# Patient Record
Sex: Female | Born: 1970 | Race: White | Hispanic: No | Smoking: Never smoker
Health system: Southern US, Community
[De-identification: ages and names within clinical notes are randomized; demographics above are authoritative.]

## PROBLEM LIST (undated history)

## (undated) DIAGNOSIS — F419 Anxiety disorder, unspecified: Secondary | ICD-10-CM

## (undated) DIAGNOSIS — G629 Polyneuropathy, unspecified: Secondary | ICD-10-CM

## (undated) DIAGNOSIS — E119 Type 2 diabetes mellitus without complications: Secondary | ICD-10-CM

## (undated) DIAGNOSIS — F32A Depression, unspecified: Secondary | ICD-10-CM

## (undated) DIAGNOSIS — I10 Essential (primary) hypertension: Secondary | ICD-10-CM

## (undated) DIAGNOSIS — E669 Obesity, unspecified: Secondary | ICD-10-CM

## (undated) DIAGNOSIS — E785 Hyperlipidemia, unspecified: Secondary | ICD-10-CM

## (undated) DIAGNOSIS — F329 Major depressive disorder, single episode, unspecified: Secondary | ICD-10-CM

## (undated) HISTORY — DX: Obesity, unspecified: E66.9

## (undated) HISTORY — DX: Major depressive disorder, single episode, unspecified: F32.9

## (undated) HISTORY — DX: Hyperlipidemia, unspecified: E78.5

## (undated) HISTORY — PX: WISDOM TOOTH EXTRACTION: SHX21

## (undated) HISTORY — DX: Depression, unspecified: F32.A

---

## 1998-04-30 ENCOUNTER — Other Ambulatory Visit: Admission: RE | Admit: 1998-04-30 | Discharge: 1998-04-30 | Payer: Self-pay | Admitting: Gynecology

## 1998-06-16 ENCOUNTER — Other Ambulatory Visit: Admission: RE | Admit: 1998-06-16 | Discharge: 1998-06-16 | Payer: Self-pay | Admitting: Obstetrics & Gynecology

## 1998-07-31 ENCOUNTER — Inpatient Hospital Stay (HOSPITAL_COMMUNITY): Admission: AD | Admit: 1998-07-31 | Discharge: 1998-08-02 | Payer: Self-pay | Admitting: Obstetrics & Gynecology

## 1998-09-04 ENCOUNTER — Other Ambulatory Visit: Admission: RE | Admit: 1998-09-04 | Discharge: 1998-09-04 | Payer: Self-pay | Admitting: Obstetrics & Gynecology

## 1999-10-22 ENCOUNTER — Emergency Department (HOSPITAL_COMMUNITY): Admission: EM | Admit: 1999-10-22 | Discharge: 1999-10-22 | Payer: Self-pay | Admitting: Emergency Medicine

## 2000-09-13 ENCOUNTER — Emergency Department (HOSPITAL_COMMUNITY): Admission: EM | Admit: 2000-09-13 | Discharge: 2000-09-13 | Payer: Self-pay | Admitting: Emergency Medicine

## 2001-04-17 ENCOUNTER — Emergency Department (HOSPITAL_COMMUNITY): Admission: EM | Admit: 2001-04-17 | Discharge: 2001-04-17 | Payer: Self-pay | Admitting: Emergency Medicine

## 2003-09-06 ENCOUNTER — Ambulatory Visit (HOSPITAL_COMMUNITY): Admission: RE | Admit: 2003-09-06 | Discharge: 2003-09-06 | Payer: Self-pay | Admitting: Family Medicine

## 2004-02-28 ENCOUNTER — Emergency Department (HOSPITAL_COMMUNITY): Admission: AD | Admit: 2004-02-28 | Discharge: 2004-02-28 | Payer: Self-pay | Admitting: Family Medicine

## 2006-02-06 ENCOUNTER — Emergency Department (HOSPITAL_COMMUNITY): Admission: EM | Admit: 2006-02-06 | Discharge: 2006-02-06 | Payer: Self-pay | Admitting: *Deleted

## 2007-05-18 ENCOUNTER — Emergency Department (HOSPITAL_COMMUNITY): Admission: EM | Admit: 2007-05-18 | Discharge: 2007-05-18 | Payer: Self-pay | Admitting: Emergency Medicine

## 2007-10-07 ENCOUNTER — Emergency Department (HOSPITAL_COMMUNITY): Admission: EM | Admit: 2007-10-07 | Discharge: 2007-10-07 | Payer: Self-pay | Admitting: Emergency Medicine

## 2008-10-26 ENCOUNTER — Emergency Department (HOSPITAL_COMMUNITY): Admission: EM | Admit: 2008-10-26 | Discharge: 2008-10-26 | Payer: Self-pay | Admitting: Family Medicine

## 2011-11-12 ENCOUNTER — Encounter: Payer: Self-pay | Admitting: Emergency Medicine

## 2011-11-12 ENCOUNTER — Emergency Department (INDEPENDENT_AMBULATORY_CARE_PROVIDER_SITE_OTHER)
Admission: EM | Admit: 2011-11-12 | Discharge: 2011-11-12 | Disposition: A | Discharge status: Other Acute Inpatient Hospital | Payer: Medicaid Other | Source: Home / Self Care | Attending: Family Medicine | Admitting: Family Medicine

## 2011-11-12 DIAGNOSIS — J310 Chronic rhinitis: Secondary | ICD-10-CM

## 2011-11-12 HISTORY — DX: Essential (primary) hypertension: I10

## 2011-11-12 MED ORDER — GUAIFENESIN-CODEINE 100-10 MG/5ML PO SYRP: 5.0000 mL | ORAL_SOLUTION | Freq: Four times a day (QID) | ORAL | Status: AC | PRN

## 2011-11-12 MED ORDER — FLUTICASONE PROPIONATE 50 MCG/ACT NA SUSP: 2.0000 | Freq: Every day | NASAL | Status: DC

## 2011-11-12 NOTE — ED Provider Notes (Signed)
History     CSN: [id number] Arrival date & time: 11/12/2011  4:08 PM   First MD Initiated Contact with Patient 11/12/11 1601      Chief Complaint  Patient presents with  . Cough    (Consider location/radiation/quality/duration/timing/severity/associated sxs/prior treatment) HPI Comments: Emily Franco presents for evaluation of persistent non-productive cough and nasal congestion. She saw her PCP on Monday and was given a medicine that was not covered by her insurance.   Patient is a 40 y.o. female presenting with cough. The history is provided by the patient.  Cough This is a new problem. The current episode started more than 1 week ago. The problem occurs constantly. The cough is non-productive. There has been no fever. Associated symptoms include chills, ear pain, rhinorrhea and myalgias. Pertinent negatives include no sore throat, no shortness of breath and no wheezing. She has tried decongestants for the symptoms. The treatment provided no relief. She is not a smoker.    Past Medical History  Diagnosis Date  . Hypertension     History reviewed. No pertinent past surgical history.  No family history on file.  History  Substance Use Topics  . Smoking status: Never Smoker   . Smokeless tobacco: Not on file  . Alcohol Use: Yes     occasional    OB History    Grav Para Term Preterm Abortions TAB SAB Ect Mult Living                  Review of Systems  Constitutional: Positive for fever and chills.  HENT: Positive for ear pain, congestion, rhinorrhea and sneezing. Negative for sore throat and trouble swallowing.   Eyes: Negative.   Respiratory: Positive for cough. Negative for shortness of breath and wheezing.   Cardiovascular: Negative.   Gastrointestinal: Negative.   Genitourinary: Negative.   Musculoskeletal: Positive for myalgias.  Skin: Negative.     Allergies  Review of patient's allergies indicates not on file.  Home Medications   Current Outpatient Rx    Name Route Sig Dispense Refill  . CITALOPRAM HYDROBROMIDE 40 MG PO TABS Oral Take 40 mg by mouth daily.      Marland Kitchen DM-GUAIFENESIN ER 30-600 MG PO TB12 Oral Take 1 tablet by mouth every 12 (twelve) hours.      Marland Kitchen LISINOPRIL 30 MG PO TABS Oral Take 30 mg by mouth daily.        BP 123/81  Pulse 87  Temp(Src) 98.8 F (37.1 C) (Oral)  Resp 17  SpO2 98%  LMP 10/16/2011  Physical Exam  Constitutional: She is oriented to person, place, and time. She appears well-developed and well-nourished.  HENT:  Head: Normocephalic and atraumatic.  Right Ear: External ear normal. Tympanic membrane is retracted. A middle ear effusion is present.  Left Ear: External ear normal. Tympanic membrane is retracted. A middle ear effusion is present.  Nose: Nose normal.  Mouth/Throat: Uvula is midline, oropharynx is clear and moist and mucous membranes are normal.  Eyes: EOM are normal. Pupils are equal, round, and reactive to light.  Neck: Normal range of motion.  Cardiovascular: Normal rate and regular rhythm.   Pulmonary/Chest: Effort normal and breath sounds normal.  Neurological: She is alert and oriented to person, place, and time.  Skin: Skin is warm and dry.    ED Course  Procedures (including critical care time)  Labs Reviewed - No data to display No results found.   No diagnosis found.    MDM  Rhinitis, serous otitis  with effusion        Richardo Priest, MD 11/12/11 [phone]

## 2011-11-12 NOTE — ED Notes (Signed)
Went to PCP on Tue and was given prescription cough med, was not helping. Pt could not fill script for Tessalon due to insurance coverage.

## 2011-11-12 NOTE — ED Notes (Signed)
Non-productive Cough, headache, sore throat. Started about 5 days ago, progressively worse.

## 2011-11-24 ENCOUNTER — Emergency Department (HOSPITAL_COMMUNITY): Payer: Medicaid Other

## 2011-11-24 ENCOUNTER — Encounter (HOSPITAL_COMMUNITY): Payer: Self-pay | Admitting: *Deleted

## 2011-11-24 ENCOUNTER — Emergency Department (HOSPITAL_COMMUNITY)
Admission: EM | Admit: 2011-11-24 | Discharge: 2011-11-24 | Disposition: A | Discharge status: Home / Self Care | Payer: Medicaid Other | Attending: Emergency Medicine | Admitting: Emergency Medicine

## 2011-11-24 DIAGNOSIS — R05 Cough: Secondary | ICD-10-CM | POA: Insufficient documentation

## 2011-11-24 DIAGNOSIS — R059 Cough, unspecified: Secondary | ICD-10-CM | POA: Insufficient documentation

## 2011-11-24 DIAGNOSIS — J40 Bronchitis, not specified as acute or chronic: Secondary | ICD-10-CM | POA: Insufficient documentation

## 2011-11-24 DIAGNOSIS — R0602 Shortness of breath: Secondary | ICD-10-CM | POA: Insufficient documentation

## 2011-11-24 DIAGNOSIS — I10 Essential (primary) hypertension: Secondary | ICD-10-CM | POA: Insufficient documentation

## 2011-11-24 MED ORDER — BENZONATATE 100 MG PO CAPS: 100.0000 mg | ORAL_CAPSULE | Freq: Three times a day (TID) | ORAL | Status: AC

## 2011-11-24 MED ORDER — ALBUTEROL SULFATE HFA 108 (90 BASE) MCG/ACT IN AERS
2.0000 | INHALATION_SPRAY | RESPIRATORY_TRACT | Status: DC
  Administered 2011-11-24: 2 via RESPIRATORY_TRACT
  Filled 2011-11-24: qty 6.7

## 2011-11-24 MED ORDER — AZITHROMYCIN 250 MG PO TABS: 250.0000 mg | ORAL_TABLET | Freq: Every day | ORAL | Status: AC

## 2011-11-24 NOTE — ED Provider Notes (Signed)
History     CSN: [id number] Arrival date & time: 11/24/2011  7:32 PM   First MD Initiated Contact with Patient 11/24/11 2058      Chief Complaint  Patient presents with  . Cough    (Consider location/radiation/quality/duration/timing/severity/associated sxs/prior treatment) Patient is a 40 y.o. female presenting with cough. The history is provided by the patient.  Cough This is a new problem. The current episode started more than 1 week ago. The problem occurs constantly. The problem has not changed since onset.The cough is productive of sputum. There has been no fever. Pertinent negatives include no chest pain, no chills, no headaches, no rhinorrhea, no sore throat, no myalgias, no shortness of breath and no wheezing. She has tried nothing for the symptoms. The treatment provided no relief. She is not a smoker.  Pt states she gets cough episodes where she is coughing and unable to stop until she vomits. No history of the same. Denies chest pain or shortness of breath. NO other symptoms.   Past Medical History  Diagnosis Date  . Hypertension     History reviewed. No pertinent past surgical history.  No family history on file.  History  Substance Use Topics  . Smoking status: Never Smoker   . Smokeless tobacco: Not on file  . Alcohol Use: Yes     occasional    OB History    Grav Para Term Preterm Abortions TAB SAB Ect Mult Living                  Review of Systems  Constitutional: Negative for fever and chills.  HENT: Negative for sore throat, rhinorrhea, neck pain and neck stiffness.   Eyes: Negative.   Respiratory: Positive for cough. Negative for chest tightness, shortness of breath and wheezing.   Cardiovascular: Negative for chest pain and leg swelling.  Gastrointestinal: Negative.   Genitourinary: Negative.   Musculoskeletal: Negative.  Negative for myalgias.  Neurological: Negative.  Negative for headaches.  Psychiatric/Behavioral: Negative.     Allergies   Review of patient's allergies indicates no known allergies.  Home Medications   Current Outpatient Rx  Name Route Sig Dispense Refill  . VITAMIN D 1000 UNITS PO TABS Oral Take 1,000 Units by mouth daily.      Marland Kitchen CITALOPRAM HYDROBROMIDE 40 MG PO TABS Oral Take 40 mg by mouth daily.      Marland Kitchen VITAMIN B 12 PO Oral Take 1 tablet by mouth daily.      Marland Kitchen DM-GUAIFENESIN ER 30-600 MG PO TB12 Oral Take 1 tablet by mouth every 12 (twelve) hours as needed. For congestion.    . OMEGA-3 FATTY ACIDS 1000 MG PO CAPS Oral Take 1-2 g by mouth 2 (two) times daily.      Marland Kitchen FLUTICASONE PROPIONATE 50 MCG/ACT NA SUSP Nasal Place 2 sprays into the nose daily. 16 g 2  . LISINOPRIL 30 MG PO TABS Oral Take 30 mg by mouth daily.      Carma Leaven M PLUS PO TABS Oral Take 1 tablet by mouth daily.        BP 105/74  Pulse 95  Temp(Src) 98.8 F (37.1 C) (Oral)  Resp 18  SpO2 96%  LMP 10/16/2011  Physical Exam  Nursing note and vitals reviewed. Constitutional: She is oriented to person, place, and time. She appears well-developed and well-nourished. No distress.  HENT:  Head: Normocephalic.  Right Ear: External ear normal.  Left Ear: External ear normal.  Mouth/Throat: Oropharynx is clear and  moist.  Eyes: Conjunctivae are normal.  Neck: Normal range of motion. Neck supple.  Cardiovascular: Normal rate, regular rhythm and normal heart sounds.   Pulmonary/Chest: Effort normal and breath sounds normal. No respiratory distress. She has no wheezes.  Abdominal: Soft. Bowel sounds are normal.  Musculoskeletal: Normal range of motion. She exhibits no edema.  Lymphadenopathy:    She has no cervical adenopathy.  Neurological: She is alert and oriented to person, place, and time.  Skin: Skin is warm and dry. No rash noted.  Psychiatric: She has a normal mood and affect.    ED Course  Procedures (including critical care time)  No results found for this or any previous visit. Dg Chest 2 View  11/24/2011  *RADIOLOGY  REPORT*  Clinical Data: Shortness of breath and cough.  CHEST - 2 VIEW  Comparison: None.  Findings: The lungs are clear without focal infiltrate, edema, pneumothorax or pleural effusion. The cardiopericardial silhouette is within normal limits for size.  Imaged bony structures of the thorax are intact.  IMPRESSION: No acute cardiopulmonary process.  Original Report Authenticated By: ERIC A. MANSELL, M.D.    X-ray negative. Suspect bronchitis or atypical infection due to cough for 2 weeks with post tussive emesis. Will cover with z-pack. Albuterol inhaler provided. Will continue on cough medications. Instructed to follow up closely if not improving.    MDM          Lottie Mussel, PA 11/25/11 [phone]

## 2011-11-24 NOTE — ED Notes (Signed)
Pt reports non-productive cough x 2 weeks, reports being around family members who has the flu.  Pt reports she would cough for 10 minutes until she vomits.

## 2011-11-25 NOTE — ED Notes (Signed)
Prescription for z-pack and tessolon caps called to [facility] [phone].

## 2011-11-28 NOTE — ED Provider Notes (Signed)
Medical screening examination/treatment/procedure(s) were performed by non-physician practitioner and as supervising physician I was immediately available for consultation/collaboration.   Milam Allbaugh, MD 11/28/11 0807 

## 2013-02-27 ENCOUNTER — Telehealth: Payer: Self-pay | Admitting: Physician Assistant

## 2013-02-27 MED ORDER — DESOGESTREL-ETHINYL ESTRADIOL 0.15-0.02/0.01 MG (21/5) PO TABS: 1.0000 | ORAL_TABLET | Freq: Every day | ORAL | Status: DC

## 2013-02-27 NOTE — Telephone Encounter (Signed)
Medication refilled per protocol.Patient needs to be seen before any further refills 

## 2013-04-15 ENCOUNTER — Ambulatory Visit (INDEPENDENT_AMBULATORY_CARE_PROVIDER_SITE_OTHER): Payer: Medicaid Other | Admitting: Physician Assistant

## 2013-04-15 ENCOUNTER — Encounter: Payer: Self-pay | Admitting: Physician Assistant

## 2013-04-15 VITALS — BP 114/86 | HR 80 | Temp 98.8°F | Resp 18 | Ht 61.0 in | Wt 221.0 lb

## 2013-04-15 DIAGNOSIS — F32A Depression, unspecified: Secondary | ICD-10-CM

## 2013-04-15 DIAGNOSIS — F329 Major depressive disorder, single episode, unspecified: Secondary | ICD-10-CM

## 2013-04-15 DIAGNOSIS — J309 Allergic rhinitis, unspecified: Secondary | ICD-10-CM

## 2013-04-15 DIAGNOSIS — E669 Obesity, unspecified: Secondary | ICD-10-CM

## 2013-04-15 DIAGNOSIS — E785 Hyperlipidemia, unspecified: Secondary | ICD-10-CM

## 2013-04-15 DIAGNOSIS — Z8249 Family history of ischemic heart disease and other diseases of the circulatory system: Secondary | ICD-10-CM

## 2013-04-15 MED ORDER — CETIRIZINE HCL 10 MG PO TABS: 10.0000 mg | ORAL_TABLET | Freq: Every day | ORAL | Status: DC

## 2013-04-15 MED ORDER — CITALOPRAM HYDROBROMIDE 20 MG PO TABS: 20.0000 mg | ORAL_TABLET | Freq: Every day | ORAL | Status: DC

## 2013-04-15 NOTE — Progress Notes (Signed)
Patient ID: CHAZLYN CUDE MRN: [id number], DOB: Apr 27, 1971, 42 y.o. Date of Encounter: @DATE @  Chief Complaint:  Chief Complaint  Patient presents with  . Medication Refill    is fasting  ck BP/Chol  rf depression meds and BCP  wants some allergy meds  . Medication Problem    not taking meds correctly or at all!!!!    HPI: 42 y.o. year old female  presents for f/u. Last OV was 02/10/2012.   She has been off her BP med for 6-7 months.  Off cholesterol med for about a year.  Ran out of Celexa about 3 weeks ago. Feels that she needs to restart this, but can try a lower dose. Concerned b/c in past, had recurrent depression when off med.    Past Medical History  Diagnosis Date  . Hypertension   . Depression   . Hyperlipidemia   . Obesity      Home Meds: See attached medication section for current medication list. Any medications entered into computer today will not appear on this note's list. The medications listed below were entered prior to today. Current Outpatient Prescriptions on File Prior to Visit  Medication Sig Dispense Refill  . desogestrel-ethinyl estradiol (KARIVA,AZURETTE,MIRCETTE) 0.15-0.02/0.01 MG (21/5) tablet Take 1 tablet by mouth daily.  1 Package  0  . cholecalciferol (VITAMIN D) 1000 UNITS tablet Take 1,000 Units by mouth daily.        . citalopram (CELEXA) 40 MG tablet Take 40 mg by mouth daily.        . Cyanocobalamin (VITAMIN B 12 PO) Take 1 tablet by mouth daily.        Marland Kitchen dextromethorphan-guaiFENesin (MUCINEX DM) 30-600 MG per 12 hr tablet Take 1 tablet by mouth every 12 (twelve) hours as needed. For congestion.      . fish oil-omega-3 fatty acids 1000 MG capsule Take 1-2 g by mouth 2 (two) times daily.        . fluticasone (FLONASE) 50 MCG/ACT nasal spray Place 2 sprays into the nose daily.  16 g  2  . lisinopril (PRINIVIL,ZESTRIL) 30 MG tablet Take 30 mg by mouth daily.        . Multiple Vitamins-Minerals (MULTIVITAMINS THER. W/MINERALS) TABS Take 1  tablet by mouth daily.         No current facility-administered medications on file prior to visit.    Allergies: No Known Allergies  History   Social History  . Marital Status: Married    Spouse Name: N/A    Number of Children: N/A  . Years of Education: N/A   Occupational History  . Not on file.   Social History Main Topics  . Smoking status: Never Smoker   . Smokeless tobacco: Not on file  . Alcohol Use: Yes     Comment: occasional  . Drug Use: No  . Sexually Active: Not on file   Other Topics Concern  . Not on file   Social History Narrative  . No narrative on file    Family History  Problem Relation Age of Onset  . Heart disease Father 63    CABG @ 43 y/o  . Heart disease Brother 90    CAD @ 60 y/o     Review of Systems:  See HPI for pertinent ROS. All other ROS negative.    Physical Exam: Blood pressure 114/86, pulse 80, temperature 98.8 F (37.1 C), temperature source Oral, resp. rate 18, height 5\' 1"  (1.549 m), weight 221 lb (  100.245 kg), last menstrual period 04/01/2013., Body mass index is 41.78 kg/(m^2). General:Severely obese WF.  Appears in no acute distress. Neck: Supple. No thyromegaly. Full ROM. No lymphadenopathy. Lungs: Clear bilaterally to auscultation without wheezes, rales, or rhonchi. Breathing is unlabored. Heart: RRR with S1 S2. No murmurs, rubs, or gallops. Abdomen: Soft, non-tender, non-distended with normoactive bowel sounds. No hepatomegaly. No rebound/guarding. No obvious abdominal masses. Musculoskeletal:  Strength and tone normal for age. Extremities/Skin: Warm and dry. No clubbing or cyanosis. No edema. No rashes or suspicious lesions. Neuro: Alert and oriented X 3. Moves all extremities spontaneously. Gait is normal. CNII-XII grossly in tact. Psych:  Responds to questions appropriately with a normal affect.     ASSESSMENT AND PLAN:  42 y.o. year old female with  1. Depression - citalopram (CELEXA) 20 MG tablet; Take 1  tablet (20 mg total) by mouth daily.  Dispense: 30 tablet; Refill: 3  2. Hyperlipidemia Currently off med. Last FLP showed LDL around 200!!! Recheck now to decide dose. Told her she MUST take med as directed!!! - COMPLETE METABOLIC PANEL WITH GFR - Lipid panel  3. Obesity Needs to be compliant with diet and exercise!! - COMPLETE METABOLIC PANEL WITH GFR  4. Family history of cardiovascular disease She has a very significant family history of Premature CAD. Needs aggressive risk factor reduction!!! - Lipid panel  5. Allergic rhinitis - cetirizine (ZYRTEC) 10 MG tablet; Take 1 tablet (10 mg total) by mouth daily.  Dispense: 30 tablet; Refill: [address] [place], [place], [facility] 04/15/2013 5:11 PM

## 2013-04-16 LAB — COMPLETE METABOLIC PANEL WITH GFR
CO2: 28 mEq/L (ref 19–32)
Calcium: 9.5 mg/dL (ref 8.4–10.5)
GFR, Est Non African American: >89 mL/min
Glucose, Bld: 81 mg/dL (ref 70–99)
Potassium: 4.6 mEq/L (ref 3.5–5.3)
Sodium: 139 mEq/L (ref 135–145)
Total Bilirubin: 0.4 mg/dL (ref 0.3–1.2)

## 2013-04-16 LAB — LIPID PANEL
LDL Cholesterol: 199 mg/dL — ABNORMAL HIGH (ref 0–99)
Total CHOL/HDL Ratio: 5 Ratio
VLDL: 21 mg/dL (ref 0–40)

## 2013-04-17 MED ORDER — ATORVASTATIN CALCIUM 80 MG PO TABS: 80.0000 mg | ORAL_TABLET | Freq: Every day | ORAL | Status: DC

## 2013-04-17 NOTE — Addendum Note (Signed)
Addended by: Elvina Mattes T on: 04/17/2013 04:07 PM   Modules accepted: Orders

## 2013-04-17 NOTE — Addendum Note (Signed)
Addended by: Elvina Mattes T on: 04/17/2013 03:58 PM   Modules accepted: Orders

## 2013-09-09 ENCOUNTER — Other Ambulatory Visit: Payer: Self-pay | Admitting: Physician Assistant

## 2013-09-10 NOTE — Telephone Encounter (Signed)
Medication refilled per protocol. 

## 2013-10-16 ENCOUNTER — Ambulatory Visit: Payer: Medicaid Other | Admitting: Physician Assistant

## 2013-12-19 ENCOUNTER — Other Ambulatory Visit: Payer: Self-pay | Admitting: Physician Assistant

## 2013-12-20 ENCOUNTER — Encounter: Payer: Self-pay | Admitting: Family Medicine

## 2013-12-20 NOTE — Telephone Encounter (Signed)
Medication refill for one time only.  Patient needs to be seen.  Letter sent for patient to call and schedule 

## 2014-01-31 ENCOUNTER — Other Ambulatory Visit: Payer: Self-pay | Admitting: Physician Assistant

## 2014-01-31 DIAGNOSIS — F32A Depression, unspecified: Secondary | ICD-10-CM

## 2014-01-31 DIAGNOSIS — F329 Major depressive disorder, single episode, unspecified: Secondary | ICD-10-CM

## 2014-01-31 NOTE — Telephone Encounter (Signed)
Pt overdue for routine follow up and repeat lab work.  Have been unable to reach by phone and letters mailed have been returned.  Refill denied until patient contact office.

## 2014-02-14 ENCOUNTER — Encounter: Payer: Self-pay | Admitting: Family Medicine

## 2014-02-14 ENCOUNTER — Ambulatory Visit (INDEPENDENT_AMBULATORY_CARE_PROVIDER_SITE_OTHER): Payer: Medicaid Other | Admitting: Family Medicine

## 2014-02-14 VITALS — BP 130/90 | HR 88 | Temp 98.0°F | Resp 18 | Ht 61.0 in | Wt 239.0 lb

## 2014-02-14 DIAGNOSIS — J309 Allergic rhinitis, unspecified: Secondary | ICD-10-CM

## 2014-02-14 DIAGNOSIS — J069 Acute upper respiratory infection, unspecified: Secondary | ICD-10-CM

## 2014-02-14 DIAGNOSIS — F3289 Other specified depressive episodes: Secondary | ICD-10-CM

## 2014-02-14 DIAGNOSIS — F32A Depression, unspecified: Secondary | ICD-10-CM

## 2014-02-14 DIAGNOSIS — E785 Hyperlipidemia, unspecified: Secondary | ICD-10-CM

## 2014-02-14 DIAGNOSIS — F329 Major depressive disorder, single episode, unspecified: Secondary | ICD-10-CM

## 2014-02-14 LAB — COMPLETE METABOLIC PANEL WITH GFR
ALBUMIN: 3.9 g/dL (ref 3.5–5.2)
ALK PHOS: 122 U/L — AB (ref 39–117)
ALT: 25 U/L (ref 0–35)
AST: 18 U/L (ref 0–37)
BILIRUBIN TOTAL: 0.3 mg/dL (ref 0.2–1.2)
BUN: 10 mg/dL (ref 6–23)
CALCIUM: 9.2 mg/dL (ref 8.4–10.5)
CO2: 27 meq/L (ref 19–32)
CREATININE: 0.67 mg/dL (ref 0.50–1.10)
Chloride: 104 mEq/L (ref 96–112)
GFR, Est African American: >89 mL/min
GFR, Est Non African American: >89 mL/min
Glucose, Bld: 76 mg/dL (ref 70–99)
Potassium: 4.4 mEq/L (ref 3.5–5.3)
SODIUM: 142 meq/L (ref 135–145)
TOTAL PROTEIN: 6.5 g/dL (ref 6.0–8.3)

## 2014-02-14 LAB — LIPID PANEL
CHOL/HDL RATIO: 4.5 ratio
Cholesterol: 219 mg/dL — ABNORMAL HIGH (ref 0–200)
HDL: 49 mg/dL (ref >39)
LDL Cholesterol: 159 mg/dL — ABNORMAL HIGH (ref 0–99)
Triglycerides: 56 mg/dL (ref <150)
VLDL: 11 mg/dL (ref 0–40)

## 2014-02-14 MED ORDER — AZITHROMYCIN 250 MG PO TABS: ORAL_TABLET | ORAL | Status: DC

## 2014-02-14 MED ORDER — CITALOPRAM HYDROBROMIDE 20 MG PO TABS: ORAL_TABLET | ORAL | Status: DC

## 2014-02-14 MED ORDER — CETIRIZINE HCL 10 MG PO TABS: 10.0000 mg | ORAL_TABLET | Freq: Every day | ORAL | Status: DC

## 2014-02-14 NOTE — Progress Notes (Signed)
   Subjective:    Patient ID: Emily PalauJacqueline R Fellows, female    DOB: 09/01/1971, 43 y.o.   MRN: [id number]  HPI  Patient has a history of hyperlipidemia. She is currently taking atorvastatin 80 mg by mouth daily. She denies any myalgias or right upper quadrant pain. She is overdue for a fasting lipid panel and a CMP. Her blood pressure is mildly elevated today at 130/90. However the patient has been taking Sudafed. Last 4 days she's developed a cough, head congestion, rhinorrhea, and a sore scratchy throat. She acquired the infection from her mother. She denies any fever. She denies any shortness of breath. She denies any pleurisy.  She also has a history of depression. She is currently taking citalopram 20 mg by mouth daily. She is doing extremely well the medication. She does not want to stop the medication. She can certainly see a benefit from taking it. Past Medical History  Diagnosis Date  . Hypertension   . Depression   . Hyperlipidemia   . Obesity    Current Outpatient Prescriptions on File Prior to Visit  Medication Sig Dispense Refill  . atorvastatin (LIPITOR) 80 MG tablet Take 1 tablet (80 mg total) by mouth daily.  30 tablet  1   No current facility-administered medications on file prior to visit.   No Known Allergies History   Social History  . Marital Status: Married    Spouse Name: N/A    Number of Children: N/A  . Years of Education: N/A   Occupational History  . Not on file.   Social History Main Topics  . Smoking status: Never Smoker   . Smokeless tobacco: Not on file  . Alcohol Use: Yes     Comment: occasional  . Drug Use: No  . Sexual Activity: Not on file   Other Topics Concern  . Not on file   Social History Narrative  . No narrative on file     Review of Systems  All other systems reviewed and are negative.       Objective:   Physical Exam  Vitals reviewed. HENT:  Right Ear: Tympanic membrane and ear canal normal.  Left Ear: Tympanic  membrane and ear canal normal.  Nose: Mucosal edema and rhinorrhea present.  Mouth/Throat: Oropharynx is clear and moist and mucous membranes are normal.  Neck: Neck supple.  Cardiovascular: Normal rate, regular rhythm and normal heart sounds.   Pulmonary/Chest: Effort normal and breath sounds normal. No respiratory distress. She has no wheezes. She has no rales.  Abdominal: Soft. Bowel sounds are normal. She exhibits no distension. There is no tenderness. There is no rebound and no guarding.  Musculoskeletal: She exhibits no edema.  Lymphadenopathy:    She has no cervical adenopathy.          Assessment & Plan:  1. Allergic rhinitis - cetirizine (ZYRTEC) 10 MG tablet; Take 1 tablet (10 mg total) by mouth daily.  Dispense: 30 tablet; Refill: 11  2. HLD (hyperlipidemia) Check fasting lipid panel. Goal LDL is less than 130. The patient's blood pressure is acceptable. I did recommend she stay and Sudafed. - COMPLETE METABOLIC PANEL WITH GFR - Lipid panel  3. Acute upper respiratory infections of unspecified site Recommended the patient take Mucinex DM over-the-counter and allow tincture of time. Anticipate spontaneous resolution over the next 3-4 days.  4. Depression-continue Celexa 20 mg by mouth daily.

## 2014-02-19 ENCOUNTER — Other Ambulatory Visit: Payer: Self-pay | Admitting: Family Medicine

## 2014-02-19 MED ORDER — ATORVASTATIN CALCIUM 80 MG PO TABS: 80.0000 mg | ORAL_TABLET | Freq: Every day | ORAL | Status: DC

## 2014-11-04 ENCOUNTER — Encounter (HOSPITAL_BASED_OUTPATIENT_CLINIC_OR_DEPARTMENT_OTHER): Payer: Self-pay | Admitting: *Deleted

## 2014-11-04 ENCOUNTER — Emergency Department (HOSPITAL_BASED_OUTPATIENT_CLINIC_OR_DEPARTMENT_OTHER)
Admission: EM | Admit: 2014-11-04 | Discharge: 2014-11-04 | Disposition: A | Discharge status: Home / Self Care | Payer: Medicaid Other | Attending: Emergency Medicine | Admitting: Emergency Medicine

## 2014-11-04 ENCOUNTER — Emergency Department (HOSPITAL_BASED_OUTPATIENT_CLINIC_OR_DEPARTMENT_OTHER): Payer: Medicaid Other

## 2014-11-04 DIAGNOSIS — E785 Hyperlipidemia, unspecified: Secondary | ICD-10-CM | POA: Diagnosis not present

## 2014-11-04 DIAGNOSIS — S8392XA Sprain of unspecified site of left knee, initial encounter: Secondary | ICD-10-CM | POA: Insufficient documentation

## 2014-11-04 DIAGNOSIS — S92352A Displaced fracture of fifth metatarsal bone, left foot, initial encounter for closed fracture: Secondary | ICD-10-CM | POA: Insufficient documentation

## 2014-11-04 DIAGNOSIS — Y998 Other external cause status: Secondary | ICD-10-CM | POA: Diagnosis not present

## 2014-11-04 DIAGNOSIS — I10 Essential (primary) hypertension: Secondary | ICD-10-CM | POA: Insufficient documentation

## 2014-11-04 DIAGNOSIS — F329 Major depressive disorder, single episode, unspecified: Secondary | ICD-10-CM | POA: Insufficient documentation

## 2014-11-04 DIAGNOSIS — W19XXXA Unspecified fall, initial encounter: Secondary | ICD-10-CM

## 2014-11-04 DIAGNOSIS — W108XXA Fall (on) (from) other stairs and steps, initial encounter: Secondary | ICD-10-CM | POA: Diagnosis not present

## 2014-11-04 DIAGNOSIS — Y9389 Activity, other specified: Secondary | ICD-10-CM | POA: Diagnosis not present

## 2014-11-04 DIAGNOSIS — E669 Obesity, unspecified: Secondary | ICD-10-CM | POA: Insufficient documentation

## 2014-11-04 DIAGNOSIS — S92301A Fracture of unspecified metatarsal bone(s), right foot, initial encounter for closed fracture: Secondary | ICD-10-CM

## 2014-11-04 DIAGNOSIS — Z791 Long term (current) use of non-steroidal anti-inflammatories (NSAID): Secondary | ICD-10-CM | POA: Diagnosis not present

## 2014-11-04 DIAGNOSIS — Z792 Long term (current) use of antibiotics: Secondary | ICD-10-CM | POA: Diagnosis not present

## 2014-11-04 DIAGNOSIS — Y929 Unspecified place or not applicable: Secondary | ICD-10-CM | POA: Insufficient documentation

## 2014-11-04 DIAGNOSIS — S8992XA Unspecified injury of left lower leg, initial encounter: Secondary | ICD-10-CM | POA: Diagnosis present

## 2014-11-04 DIAGNOSIS — Z79899 Other long term (current) drug therapy: Secondary | ICD-10-CM | POA: Diagnosis not present

## 2014-11-04 MED ORDER — HYDROCODONE-ACETAMINOPHEN 5-325 MG PO TABS: 1.0000 | ORAL_TABLET | Freq: Four times a day (QID) | ORAL | Status: DC | PRN

## 2014-11-04 MED ORDER — NAPROXEN 500 MG PO TABS: 500.0000 mg | ORAL_TABLET | Freq: Two times a day (BID) | ORAL | Status: DC

## 2014-11-04 NOTE — ED Provider Notes (Signed)
CSN: [id number]     Arrival date & time 11/04/14  1421 History   First MD Initiated Contact with Patient 11/04/14 1511     Chief Complaint  Patient presents with  . Knee Injury     (Consider location/radiation/quality/duration/timing/severity/associated sxs/prior Treatment) The history is provided by the patient.   43 year old female status post fall last evening down 2 steps injuring her right foot and left knee. Patient able to walk okay on the left leg. No other injuries. Most of the pain today is to the right foot. Which is 8 out of 10. Some discomfort to the left knee more 2 out of 10.  Past Medical History  Diagnosis Date  . Hypertension   . Depression   . Hyperlipidemia   . Obesity    History reviewed. No pertinent past surgical history. Family History  Problem Relation Age of Onset  . Heart disease Father 5258    CABG @ 45 y/o  . Heart disease Brother 7643    CAD @ 45 y/o   History  Substance Use Topics  . Smoking status: Never Smoker   . Smokeless tobacco: Not on file  . Alcohol Use: Yes     Comment: occasional   OB History    No data available     Review of Systems  Constitutional: Negative for fever.  HENT: Negative for congestion.   Eyes: Negative for redness.  Respiratory: Negative for shortness of breath.   Cardiovascular: Negative for chest pain.  Gastrointestinal: Negative for nausea, vomiting and abdominal pain.  Genitourinary: Negative for dysuria.  Musculoskeletal: Positive for joint swelling. Negative for back pain and neck pain.  Neurological: Negative for headaches.  Hematological: Does not bruise/bleed easily.  Psychiatric/Behavioral: Negative for confusion.      Allergies  Review of patient's allergies indicates no known allergies.  Home Medications   Prior to Admission medications   Medication Sig Start Date End Date Taking? Authorizing Provider  atorvastatin (LIPITOR) 80 MG tablet Take 1 tablet (80 mg total) by mouth daily. 02/19/14    Donita BrooksWarren T Pickard, MD  azithromycin (ZITHROMAX) 250 MG tablet 2 tabs poqday1, 1 tab poqday 2-5 02/14/14   Donita BrooksWarren T Pickard, MD  cetirizine (ZYRTEC) 10 MG tablet Take 1 tablet (10 mg total) by mouth daily. 02/14/14   Donita BrooksWarren T Pickard, MD  citalopram (CELEXA) 20 MG tablet TAKE 1 TABLET (20 MG TOTAL) BY MOUTH DAILY. 02/14/14   Donita BrooksWarren T Pickard, MD  HYDROcodone-acetaminophen (NORCO/VICODIN) 5-325 MG per tablet Take 1-2 tablets by mouth every 6 (six) hours as needed for moderate pain. 11/04/14   Vanetta MuldersScott Meta Kroenke, MD  naproxen (NAPROSYN) 500 MG tablet Take 1 tablet (500 mg total) by mouth 2 (two) times daily. 11/04/14   Vanetta MuldersScott Bynum Mccullars, MD   There were no vitals taken for this visit. Physical Exam  Constitutional: She is oriented to person, place, and time. She appears well-developed and well-nourished. No distress.  HENT:  Head: Normocephalic and atraumatic.  Mouth/Throat: Oropharynx is clear and moist.  Eyes: Conjunctivae and EOM are normal. Pupils are equal, round, and reactive to light.  Neck: Normal range of motion.  Cardiovascular: Normal rate, regular rhythm and normal heart sounds.   No murmur heard. Pulmonary/Chest: Effort normal and breath sounds normal.  Abdominal: Soft. Bowel sounds are normal. There is no tenderness.  Musculoskeletal: Normal range of motion. She exhibits tenderness.  Left knee with minimal swelling. No tenderness to palpation along the joint line. Patella is not dislocated. Good range of motion.  Distally neurovascularly intact. Right foot with the lateral tenderness over the fifth metatarsal. No bruising. Good cap refill to the toes. No ankle tenderness no proximal leg tenderness. Dorsalis pedis pulses 2+ on both feet.  Neurological: She is alert and oriented to person, place, and time. No cranial nerve deficit. She exhibits normal muscle tone. Coordination normal.  Skin: Skin is warm. No rash noted.  Nursing note and vitals reviewed.   ED Course  Procedures (including  critical care time) Labs Review Labs Reviewed - No data to display  Imaging Review Dg Knee Complete 4 Views Left  11/04/2014   CLINICAL DATA:  Left knee pain post fall last night  EXAM: LEFT KNEE - COMPLETE 4+ VIEW  COMPARISON:  None.  FINDINGS: Four views of the left knee submitted. No acute fracture or subluxation. Mild narrowing of medial joint compartment. No joint effusion.  IMPRESSION: No acute fracture or subluxation. Mild narrowing of medial joint compartment. No joint effusion.   Electronically Signed   By: Natasha MeadLiviu  Pop M.D.   On: 11/04/2014 15:10   Dg Foot Complete Right  11/04/2014   CLINICAL DATA:  Right foot pain post fall last night, left knee pain  EXAM: RIGHT FOOT COMPLETE - 3+ VIEW  COMPARISON:  None.  FINDINGS: Three views of the right foot submitted. There is oblique mild displaced fracture mid and distal aspect fifth metatarsal.  IMPRESSION: Mild displaced oblique fracture distal fifth metatarsal.   Electronically Signed   By: Natasha MeadLiviu  Pop M.D.   On: 11/04/2014 15:11     EKG Interpretation None      MDM   Final diagnoses:  Metatarsal fracture, right, closed, initial encounter  Knee sprain and strain, left, initial encounter   Patient status post fall down 2 steps last night. Main pain is to the right foot. There is also some discomfort to the left knee. Patient able to ambulate with the left leg. X-rays of the right foot show a distal fifth metatarsal fracture. With some displacement. Neurovascularly intact. Will treat with a posterior splint and crutches. Patient seems to be able to get around with the left knee okay. Some mild swelling no effusion no fractures to the left knee. Good range of motion. No instability. Distally neurovascular is intact to the left leg. No other injuries.    Vanetta MuldersScott Connelly [place], MD 11/04/14 1544

## 2014-11-04 NOTE — Discharge Instructions (Signed)
Cast or Splint Care °Casts and splints support injured limbs and keep bones from moving while they heal. It is important to care for your cast or splint at home.   °HOME CARE INSTRUCTIONS °· Keep the cast or splint uncovered during the drying period. It can take 24 to 48 hours to dry if it is made of plaster. A fiberglass cast will dry in less than 1 hour. °· Do not rest the cast on anything harder than a pillow for the first 24 hours. °· Do not put weight on your injured limb or apply pressure to the cast until your health care provider gives you permission. °· Keep the cast or splint dry. Wet casts or splints can lose their shape and may not support the limb as well. A wet cast that has lost its shape can also create harmful pressure on your skin when it dries. Also, wet skin can become infected. °¨ Cover the cast or splint with a plastic bag when bathing or when out in the rain or snow. If the cast is on the trunk of the body, take sponge baths until the cast is removed. °¨ If your cast does become wet, dry it with a towel or a blow dryer on the cool setting only. °· Keep your cast or splint clean. Soiled casts may be wiped with a moistened cloth. °· Do not place any hard or soft foreign objects under your cast or splint, such as cotton, toilet paper, lotion, or powder. °· Do not try to scratch the skin under the cast with any object. The object could get stuck inside the cast. Also, scratching could lead to an infection. If itching is a problem, use a blow dryer on a cool setting to relieve discomfort. °· Do not trim or cut your cast or remove padding from inside of it. °· Exercise all joints next to the injury that are not immobilized by the cast or splint. For example, if you have a long leg cast, exercise the hip joint and toes. If you have an arm cast or splint, exercise the shoulder, elbow, thumb, and fingers. °· Elevate your injured arm or leg on 1 or 2 pillows for the first 1 to 3 days to decrease  swelling and pain. It is best if you can comfortably elevate your cast so it is higher than your heart. °SEEK MEDICAL CARE IF:  °· Your cast or splint cracks. °· Your cast or splint is too tight or too loose. °· You have unbearable itching inside the cast. °· Your cast becomes wet or develops a soft spot or area. °· You have a bad smell coming from inside your cast. °· You get an object stuck under your cast. °· Your skin around the cast becomes red or raw. °· You have new pain or worsening pain after the cast has been applied. °SEEK IMMEDIATE MEDICAL CARE IF:  °· You have fluid leaking through the cast. °· You are unable to move your fingers or toes. °· You have discolored (blue or white), cool, painful, or very swollen fingers or toes beyond the cast. °· You have tingling or numbness around the injured area. °· You have severe pain or pressure under the cast. °· You have any difficulty with your breathing or have shortness of breath. °· You have chest pain. °Document Released: 11/25/2000 Document Revised: 09/18/2013 Document Reviewed: 06/06/2013 °ExitCare® Patient Information ©2015 [employer]. This information is not intended to replace advice given to you by your health care   provider. Make sure you discuss any questions you have with your health care provider.  Use the crutches at all times. No weight bearing on the right foot. Keep the splint dry. Call orthopedics for follow-up early best to try to call tomorrow. They don't need to see you until next week. Take the Naprosyn as directed. Supplement with hydrocodone as needed for additional pain relief. Elevate the right foot as much as possible for the next 2 days.
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
## 2014-11-10 ENCOUNTER — Telehealth: Payer: Self-pay | Admitting: *Deleted

## 2014-11-10 DIAGNOSIS — S8392XA Sprain of unspecified site of left knee, initial encounter: Secondary | ICD-10-CM

## 2014-11-10 DIAGNOSIS — S92301A Fracture of unspecified metatarsal bone(s), right foot, initial encounter for closed fracture: Secondary | ICD-10-CM

## 2014-11-10 NOTE — Telephone Encounter (Signed)
Received a call from pt stating that she went ot ED for a broken foot and they had referred her to [facility] on [address] [place] d/t pts insurance she can not make appointment has to come from PCP.  OK to do referral?

## 2014-11-10 NOTE — Telephone Encounter (Signed)
Yes. Approved. 

## 2014-11-11 NOTE — Telephone Encounter (Signed)
Referral placed for ortho

## 2014-11-14 ENCOUNTER — Encounter (HOSPITAL_COMMUNITY): Payer: Self-pay | Admitting: *Deleted

## 2014-11-14 NOTE — Progress Notes (Signed)
LMP 1 and 1/2 years ago, has not had any testing to determine if menopausal.

## 2014-11-16 NOTE — H&P (Signed)
PREOPERATIVE H&P  Chief Complaint: Right 5th metatarsal fracture  HPI: Emily Franco is a 43 y.o. female who presents for surgical treatment of Right 5th metatarsal fracture.  She denies any changes in medical history.  Past Medical History  Diagnosis Date  . Hypertension   . Depression   . Hyperlipidemia   . Obesity   . Diabetes mellitus without complication     gestational  . Anxiety     history of not lately   Past Surgical History  Procedure Laterality Date  . Wisdom tooth extraction      "laughing gas"   History   Social History  . Marital Status: Divorced    Spouse Name: N/A    Number of Children: N/A  . Years of Education: N/A   Social History Main Topics  . Smoking status: Never Smoker   . Smokeless tobacco: None  . Alcohol Use: 2.4 oz/week    4 Glasses of wine per week     Comment: occasional  . Drug Use: Yes    Special: Marijuana  . Sexual Activity: None   Other Topics Concern  . None   Social History Narrative   Family History  Problem Relation Age of Onset  . Heart disease Father 7358    CABG @ 45 y/o  . Heart disease Brother 3443    CAD @ 45 y/o   No Known Allergies Prior to Admission medications   Medication Sig Start Date End Date Taking? Authorizing Provider  Acetaminophen-Caffeine (EXCEDRIN TENSION HEADACHE PO) Take by mouth.   Yes Historical Provider, MD  ibuprofen (ADVIL,MOTRIN) 200 MG tablet Take 200 mg by mouth every 6 (six) hours as needed.   Yes Historical Provider, MD  atorvastatin (LIPITOR) 80 MG tablet Take 1 tablet (80 mg total) by mouth daily. 02/19/14   Donita BrooksWarren T Pickard, MD  azithromycin (ZITHROMAX) 250 MG tablet 2 tabs poqday1, 1 tab poqday 2-5 02/14/14   Donita BrooksWarren T Pickard, MD  cetirizine (ZYRTEC) 10 MG tablet Take 1 tablet (10 mg total) by mouth daily. 02/14/14   Donita BrooksWarren T Pickard, MD  citalopram (CELEXA) 20 MG tablet TAKE 1 TABLET (20 MG TOTAL) BY MOUTH DAILY. 02/14/14   Donita BrooksWarren T Pickard, MD  HYDROcodone-acetaminophen  (NORCO/VICODIN) 5-325 MG per tablet Take 1-2 tablets by mouth every 6 (six) hours as needed for moderate pain. 11/04/14   Vanetta MuldersScott Zackowski, MD  naproxen (NAPROSYN) 500 MG tablet Take 1 tablet (500 mg total) by mouth 2 (two) times daily. 11/04/14   Vanetta MuldersScott Zackowski, MD     Positive ROS: All other systems have been reviewed and were otherwise negative with the exception of those mentioned in the HPI and as above.  Physical Exam: General: Alert, no acute distress Cardiovascular: No pedal edema Respiratory: No cyanosis, no use of accessory musculature GI: abdomen soft Skin: No lesions in the area of chief complaint Neurologic: Sensation intact distally Psychiatric: Patient is competent for consent with normal mood and affect Lymphatic: no lymphedema  MUSCULOSKELETAL: exam stable  Assessment: Right 5th metatarsal fracture  Plan: Plan for Procedure(s): OPEN REDUCTION INTERNAL FIXATION (ORIF) RIGHT 5TH METATARSAL FRACTURE  The risks benefits and alternatives were discussed with the patient including but not limited to the risks of nonoperative treatment, versus surgical intervention including infection, bleeding, nerve injury,  blood clots, cardiopulmonary complications, morbidity, mortality, among others, and they were willing to proceed.   Cheral AlmasXu, Mamoru Takeshita Michael, MD   11/16/2014 8:49 PM

## 2014-11-17 ENCOUNTER — Ambulatory Visit (HOSPITAL_COMMUNITY)
Admission: RE | Admit: 2014-11-17 | Discharge: 2014-11-17 | Disposition: A | Discharge status: Home / Self Care | Payer: Medicaid Other | Source: Ambulatory Visit | Attending: Orthopaedic Surgery | Admitting: Orthopaedic Surgery

## 2014-11-17 ENCOUNTER — Encounter (HOSPITAL_COMMUNITY)
Admission: RE | Disposition: A | Discharge status: Home / Self Care | Payer: Self-pay | Source: Ambulatory Visit | Attending: Orthopaedic Surgery

## 2014-11-17 ENCOUNTER — Ambulatory Visit (HOSPITAL_COMMUNITY): Payer: Medicaid Other | Admitting: Anesthesiology

## 2014-11-17 ENCOUNTER — Encounter (HOSPITAL_COMMUNITY): Payer: Self-pay | Admitting: *Deleted

## 2014-11-17 ENCOUNTER — Other Ambulatory Visit (HOSPITAL_COMMUNITY): Payer: Self-pay | Admitting: Orthopaedic Surgery

## 2014-11-17 DIAGNOSIS — E785 Hyperlipidemia, unspecified: Secondary | ICD-10-CM | POA: Diagnosis not present

## 2014-11-17 DIAGNOSIS — Z6841 Body Mass Index (BMI) 40.0 and over, adult: Secondary | ICD-10-CM | POA: Diagnosis not present

## 2014-11-17 DIAGNOSIS — X58XXXA Exposure to other specified factors, initial encounter: Secondary | ICD-10-CM | POA: Insufficient documentation

## 2014-11-17 DIAGNOSIS — F329 Major depressive disorder, single episode, unspecified: Secondary | ICD-10-CM | POA: Diagnosis not present

## 2014-11-17 DIAGNOSIS — F419 Anxiety disorder, unspecified: Secondary | ICD-10-CM | POA: Diagnosis not present

## 2014-11-17 DIAGNOSIS — Y998 Other external cause status: Secondary | ICD-10-CM | POA: Diagnosis not present

## 2014-11-17 DIAGNOSIS — I10 Essential (primary) hypertension: Secondary | ICD-10-CM | POA: Diagnosis not present

## 2014-11-17 DIAGNOSIS — Y9289 Other specified places as the place of occurrence of the external cause: Secondary | ICD-10-CM | POA: Insufficient documentation

## 2014-11-17 DIAGNOSIS — Y9389 Activity, other specified: Secondary | ICD-10-CM | POA: Insufficient documentation

## 2014-11-17 DIAGNOSIS — S92351A Displaced fracture of fifth metatarsal bone, right foot, initial encounter for closed fracture: Secondary | ICD-10-CM | POA: Diagnosis present

## 2014-11-17 DIAGNOSIS — F1290 Cannabis use, unspecified, uncomplicated: Secondary | ICD-10-CM | POA: Diagnosis not present

## 2014-11-17 HISTORY — PX: ORIF TOE FRACTURE: SHX5032

## 2014-11-17 HISTORY — DX: Anxiety disorder, unspecified: F41.9

## 2014-11-17 HISTORY — DX: Type 2 diabetes mellitus without complications: E11.9

## 2014-11-17 LAB — CBC
HEMATOCRIT: 39.3 % (ref 36.0–46.0)
HEMOGLOBIN: 13.2 g/dL (ref 12.0–15.0)
MCH: 28.9 pg (ref 26.0–34.0)
MCHC: 33.6 g/dL (ref 30.0–36.0)
MCV: 86 fL (ref 78.0–100.0)
Platelets: 333 10*3/uL (ref 150–400)
RBC: 4.57 MIL/uL (ref 3.87–5.11)
RDW: 13.2 % (ref 11.5–15.5)
WBC: 4.3 10*3/uL (ref 4.0–10.5)

## 2014-11-17 LAB — BASIC METABOLIC PANEL
Anion gap: 11 (ref 5–15)
BUN: 11 mg/dL (ref 6–23)
CHLORIDE: 103 meq/L (ref 96–112)
CO2: 26 meq/L (ref 19–32)
Calcium: 9.3 mg/dL (ref 8.4–10.5)
Creatinine, Ser: 0.6 mg/dL (ref 0.50–1.10)
GFR calc Af Amer: >90 mL/min (ref >90)
GLUCOSE: 81 mg/dL (ref 70–99)
POTASSIUM: 3.8 meq/L (ref 3.7–5.3)
SODIUM: 140 meq/L (ref 137–147)

## 2014-11-17 LAB — HCG, SERUM, QUALITATIVE: Preg, Serum: NEGATIVE

## 2014-11-17 SURGERY — OPEN REDUCTION INTERNAL FIXATION (ORIF) METATARSAL (TOE) FRACTURE
Anesthesia: General | Laterality: Right

## 2014-11-17 MED ORDER — CEFAZOLIN SODIUM-DEXTROSE 2-3 GM-% IV SOLR
INTRAVENOUS | Status: DC | PRN
  Administered 2014-11-17: 2 g via INTRAVENOUS

## 2014-11-17 MED ORDER — HYDROCODONE-ACETAMINOPHEN 5-325 MG PO TABS: 1.0000 | ORAL_TABLET | Freq: Four times a day (QID) | ORAL | Status: DC | PRN

## 2014-11-17 MED ORDER — BUPIVACAINE HCL (PF) 0.25 % IJ SOLN
INTRAMUSCULAR | Status: AC
  Filled 2014-11-17: qty 30

## 2014-11-17 MED ORDER — ROCURONIUM BROMIDE 50 MG/5ML IV SOLN
INTRAVENOUS | Status: AC
  Filled 2014-11-17: qty 1

## 2014-11-17 MED ORDER — SODIUM CHLORIDE 0.9 % IJ SOLN
INTRAMUSCULAR | Status: AC
  Filled 2014-11-17: qty 10

## 2014-11-17 MED ORDER — LACTATED RINGERS IV SOLN
INTRAVENOUS | Status: DC | PRN
  Administered 2014-11-17: 15:00:00 via INTRAVENOUS

## 2014-11-17 MED ORDER — LACTATED RINGERS IV SOLN: INTRAVENOUS | Status: DC

## 2014-11-17 MED ORDER — ONDANSETRON HCL 4 MG/2ML IJ SOLN
INTRAMUSCULAR | Status: AC
  Filled 2014-11-17: qty 2

## 2014-11-17 MED ORDER — CEFAZOLIN SODIUM-DEXTROSE 2-3 GM-% IV SOLR: 2.0000 g | INTRAVENOUS | Status: DC

## 2014-11-17 MED ORDER — MIDAZOLAM HCL 5 MG/5ML IJ SOLN
INTRAMUSCULAR | Status: DC | PRN
  Administered 2014-11-17: 2 mg via INTRAVENOUS

## 2014-11-17 MED ORDER — MEPERIDINE HCL 25 MG/ML IJ SOLN: 6.2500 mg | INTRAMUSCULAR | Status: DC | PRN

## 2014-11-17 MED ORDER — 0.9 % SODIUM CHLORIDE (POUR BTL) OPTIME
TOPICAL | Status: DC | PRN
  Administered 2014-11-17: 1000 mL

## 2014-11-17 MED ORDER — FENTANYL CITRATE 0.05 MG/ML IJ SOLN
INTRAMUSCULAR | Status: AC
  Filled 2014-11-17: qty 5

## 2014-11-17 MED ORDER — LIDOCAINE HCL (CARDIAC) 20 MG/ML IV SOLN
INTRAVENOUS | Status: AC
  Filled 2014-11-17: qty 5

## 2014-11-17 MED ORDER — FENTANYL CITRATE 0.05 MG/ML IJ SOLN: 25.0000 ug | INTRAMUSCULAR | Status: DC | PRN

## 2014-11-17 MED ORDER — CEFAZOLIN SODIUM-DEXTROSE 2-3 GM-% IV SOLR
INTRAVENOUS | Status: AC
  Filled 2014-11-17: qty 50

## 2014-11-17 MED ORDER — EPHEDRINE SULFATE 50 MG/ML IJ SOLN
INTRAMUSCULAR | Status: AC
  Filled 2014-11-17: qty 1

## 2014-11-17 MED ORDER — FENTANYL CITRATE 0.05 MG/ML IJ SOLN
INTRAMUSCULAR | Status: DC | PRN
  Administered 2014-11-17 (×2): 50 ug via INTRAVENOUS

## 2014-11-17 MED ORDER — ONDANSETRON HCL 4 MG/2ML IJ SOLN
INTRAMUSCULAR | Status: DC | PRN
  Administered 2014-11-17: 4 mg via INTRAVENOUS

## 2014-11-17 MED ORDER — PROPOFOL 10 MG/ML IV BOLUS
INTRAVENOUS | Status: AC
  Filled 2014-11-17: qty 20

## 2014-11-17 MED ORDER — LIDOCAINE HCL (CARDIAC) 20 MG/ML IV SOLN
INTRAVENOUS | Status: DC | PRN
  Administered 2014-11-17: 10 mg via INTRAVENOUS

## 2014-11-17 MED ORDER — MIDAZOLAM HCL 2 MG/2ML IJ SOLN
INTRAMUSCULAR | Status: AC
  Filled 2014-11-17: qty 2

## 2014-11-17 MED ORDER — BUPIVACAINE HCL (PF) 0.25 % IJ SOLN
INTRAMUSCULAR | Status: DC | PRN
  Administered 2014-11-17: 10 mL

## 2014-11-17 MED ORDER — PROMETHAZINE HCL 25 MG/ML IJ SOLN: 6.2500 mg | INTRAMUSCULAR | Status: DC | PRN

## 2014-11-17 MED ORDER — PROPOFOL 10 MG/ML IV BOLUS
INTRAVENOUS | Status: DC | PRN
  Administered 2014-11-17: 200 mg via INTRAVENOUS

## 2014-11-17 MED ORDER — ASPIRIN EC 325 MG PO TBEC: 325.0000 mg | DELAYED_RELEASE_TABLET | Freq: Two times a day (BID) | ORAL | Status: DC

## 2014-11-17 SURGICAL SUPPLY — 42 items
BANDAGE ELASTIC 3 VELCRO ST LF (GAUZE/BANDAGES/DRESSINGS) ×1 IMPLANT
BANDAGE ELASTIC 4 VELCRO ST LF (GAUZE/BANDAGES/DRESSINGS) ×1 IMPLANT
BLADE SURG 10 STRL SS (BLADE) ×2 IMPLANT
BLADE SURG 15 STRL LF DISP TIS (BLADE) ×1 IMPLANT
BLADE SURG 15 STRL SS (BLADE) ×2
BLADE SURG ROTATE 9660 (MISCELLANEOUS) IMPLANT
BNDG CMPR 9X4 STRL LF SNTH (GAUZE/BANDAGES/DRESSINGS) ×1
BNDG ESMARK 4X9 LF (GAUZE/BANDAGES/DRESSINGS) ×2 IMPLANT
BNDG GAUZE ELAST 4 BULKY (GAUZE/BANDAGES/DRESSINGS) ×2 IMPLANT
CORDS BIPOLAR (ELECTRODE) ×2 IMPLANT
COVER SURGICAL LIGHT HANDLE (MISCELLANEOUS) ×2 IMPLANT
CUFF TOURNIQUET SINGLE 18IN (TOURNIQUET CUFF) ×2 IMPLANT
CUFF TOURNIQUET SINGLE 24IN (TOURNIQUET CUFF) IMPLANT
DRAPE C-ARM 42X72 X-RAY (DRAPES) IMPLANT
DRAPE U-SHAPE 47X51 STRL (DRAPES) ×2 IMPLANT
ELECT CAUTERY BLADE 6.4 (BLADE) ×2 IMPLANT
GAUZE SPONGE 4X4 12PLY STRL (GAUZE/BANDAGES/DRESSINGS) ×2 IMPLANT
GAUZE XEROFORM 1X8 LF (GAUZE/BANDAGES/DRESSINGS) ×2 IMPLANT
GLOVE NEODERM STRL 7.5 LF PF (GLOVE) ×1 IMPLANT
GLOVE SURG NEODERM 7.5  LF PF (GLOVE) ×1
GOWN STRL REIN XL XLG (GOWN DISPOSABLE) ×2 IMPLANT
K-WIRE DBL TROCAR .062X4 ×4 IMPLANT
KIT BASIN OR (CUSTOM PROCEDURE TRAY) ×2 IMPLANT
KIT ROOM TURNOVER OR (KITS) ×2 IMPLANT
KWIRE DBL TROCAR .062X4 IMPLANT
NEEDLE 22X1 1/2 (OR ONLY) (NEEDLE) IMPLANT
NS IRRIG 1000ML POUR BTL (IV SOLUTION) ×2 IMPLANT
PACK ORTHO EXTREMITY (CUSTOM PROCEDURE TRAY) ×2 IMPLANT
PAD ARMBOARD 7.5X6 YLW CONV (MISCELLANEOUS) ×4 IMPLANT
PAD CAST 4YDX4 CTTN HI CHSV (CAST SUPPLIES) ×1 IMPLANT
PADDING CAST COTTON 4X4 STRL (CAST SUPPLIES) ×2
SCOTCHCAST PLUS 4X4 WHITE (CAST SUPPLIES) ×1 IMPLANT
SPONGE LAP 4X18 X RAY DECT (DISPOSABLE) IMPLANT
SUT ETHILON 4 0 PS 2 18 (SUTURE) ×2 IMPLANT
SUT MNCRL AB 4-0 PS2 18 (SUTURE) IMPLANT
SUT PROLENE 3 0 PS 2 (SUTURE) IMPLANT
SUT VIC AB 3-0 FS2 27 (SUTURE) IMPLANT
SYR CONTROL 10ML LL (SYRINGE) IMPLANT
TOWEL OR 17X24 6PK STRL BLUE (TOWEL DISPOSABLE) ×2 IMPLANT
TOWEL OR 17X26 10 PK STRL BLUE (TOWEL DISPOSABLE) ×2 IMPLANT
TUBE CONNECTING 12X1/4 (SUCTIONS) ×2 IMPLANT
UNDERPAD 30X30 INCONTINENT (UNDERPADS AND DIAPERS) ×2 IMPLANT

## 2014-11-17 NOTE — Op Note (Signed)
   Date of Surgery: 11/17/2014  INDICATIONS: Ms. Emily Franco is a 43 y.o.-year-old female who sustained an unstable right 5th metatarsal fracture;  The patient did consent to the procedure after discussion of the risks and benefits.  PREOPERATIVE DIAGNOSIS: Unstable right 5th metatarsal fracture  POSTOPERATIVE DIAGNOSIS: Same.  PROCEDURE: Open treatment with internal fixation of metatarsal fracture  SURGEON: N. Glee ArvinMichael Yanci Bachtell, M.D.  ASSIST: none.  ANESTHESIA:  general  IV FLUIDS AND URINE: See anesthesia.  ESTIMATED BLOOD LOSS: none mL.  IMPLANTS: 0.062 K wires x 2  DRAINS: none  COMPLICATIONS: None.  DESCRIPTION OF PROCEDURE: The patient was brought to the operating room and placed supine on the operating table.  The patient had been signed prior to the procedure and this was documented. The patient had the anesthesia placed by the anesthesiologist.  A time-out was performed to confirm that this was the correct patient, site, side and location. The patient had an SCD on the opposite lower extremity. The patient did receive antibiotics prior to the incision and was re-dosed during the procedure as needed at indicated intervals.  The patient had the operative extremity prepped and draped in the standard surgical fashion.    The extremity was exsanguinated using Esmarch bandage and the and this was used as a Sports administratorsmarch tourniquet. With the use of a mini C-arm the fracture was reduced with a towel clamp. With the fracture reduced and confirmed under mini C-arm 2 parallel pins were advanced from a lateral to medial direction from the fifth metatarsal into the fourth metatarsal. Final x-rays were taken to confirm adequate pin placement and reduction of the fracture once the towel clamp was removed area sterile dressings were applied. The foot was placed in a short leg cast. The patient was extubated and transferred to the PACU in stable condition.  POSTOPERATIVE PLAN: The patient will be nonweightbearing  to the right lower extremity. She will be placed on aspirin for DVT prophylaxis. She will return in the office in 2 weeks for a follow-up.  Mayra ReelN. Michael Kyuss Hale, MD [facility] [phone] 3:57 PM

## 2014-11-17 NOTE — Discharge Instructions (Signed)
1. Do not put weight on the right foot 2. Take aspirin to prevent blood clots  What to eat:  For your first meals, you should eat lightly; only small meals initially.  If you do not have nausea, you may eat larger meals.  Avoid spicy, greasy and heavy food.    General Anesthesia, Adult, Care After  Refer to this sheet in the next few weeks. These instructions provide you with information on caring for yourself after your procedure. Your health care provider may also give you more specific instructions. Your treatment has been planned according to current medical practices, but problems sometimes occur. Call your health care provider if you have any problems or questions after your procedure.  WHAT TO EXPECT AFTER THE PROCEDURE  After the procedure, it is typical to experience:  Sleepiness.  Nausea and vomiting. HOME CARE INSTRUCTIONS  For the first 24 hours after general anesthesia:  Have a responsible person with you.  Do not drive a car. If you are alone, do not take public transportation.  Do not drink alcohol.  Do not take medicine that has not been prescribed by your health care provider.  Do not sign important papers or make important decisions.  You may resume a normal diet and activities as directed by your health care provider.  Change bandages (dressings) as directed.  If you have questions or problems that seem related to general anesthesia, call the hospital and ask for the anesthetist or anesthesiologist on call. SEEK MEDICAL CARE IF:  You have nausea and vomiting that continue the day after anesthesia.  You develop a rash. SEEK IMMEDIATE MEDICAL CARE IF:  You have difficulty breathing.  You have chest pain.  You have any allergic problems. Document Released: 03/06/2001 Document Revised: 07/31/2013 Document Reviewed: 06/13/2013  [facility] Patient Information 2014 [place], [place].

## 2014-11-17 NOTE — Anesthesia Preprocedure Evaluation (Addendum)
Anesthesia Evaluation  Patient identified by MRN, date of birth, ID band Patient awake    Reviewed: Allergy & Precautions, H&P , NPO status , Patient's Chart, lab work & pertinent test results, reviewed documented beta blocker date and time   History of Anesthesia Complications Negative for: history of anesthetic complications  Airway Mallampati: II  TM Distance: >3 FB Neck ROM: Full    Dental  (+) Dental Advisory Given, Teeth Intact   Pulmonary neg pulmonary ROS,  breath sounds clear to auscultation        Cardiovascular hypertension (no longer on BP meds), - anginaRhythm:Regular Rate:Normal     Neuro/Psych Anxiety Depression negative neurological ROS     GI/Hepatic negative GI ROS, (+)     substance abuse  marijuana use,   Endo/Other  Morbid obesity  Renal/GU negative Renal ROS     Musculoskeletal   Abdominal (+) + obese,   Peds  Hematology   Anesthesia Other Findings   Reproductive/Obstetrics 11/17/14 Preg test: neg                           Anesthesia Physical Anesthesia Plan  ASA: II  Anesthesia Plan: General   Post-op Pain Management:    Induction: Intravenous  Airway Management Planned: LMA  Additional Equipment:   Intra-op Plan:   Post-operative Plan:   Informed Consent: I have reviewed the patients History and Physical, chart, labs and discussed the procedure including the risks, benefits and alternatives for the proposed anesthesia with the patient or authorized representative who has indicated his/her understanding and acceptance.   Dental advisory given  Plan Discussed with: CRNA and Surgeon  Anesthesia Plan Comments: (Will discuss popliteal block for post op pain management Pt prefers GA with digital block by surgeon)       Anesthesia Quick Evaluation

## 2014-11-17 NOTE — Anesthesia Postprocedure Evaluation (Signed)
  Anesthesia Post-op Note  Patient: Emily Franco  Procedure(s) Performed: Procedure(s): OPEN REDUCTION INTERNAL FIXATION (ORIF) RIGHT 5TH METATARSAL FRACTURE (Right)  Patient Location: PACU  Anesthesia Type:General  Level of Consciousness: awake, alert , oriented and patient cooperative  Airway and Oxygen Therapy: Patient Spontanous Breathing  Post-op Pain: none  Post-op Assessment: Post-op Vital signs reviewed, Patient's Cardiovascular Status Stable, Respiratory Function Stable, Patent Airway, No signs of Nausea or vomiting and Pain level controlled  Post-op Vital Signs: Reviewed and stable  Last Vitals:  Filed Vitals:   11/17/14 1645  BP: 132/79  Pulse: 71  Temp:   Resp: 15    Complications: No apparent anesthesia complications

## 2014-11-17 NOTE — Transfer of Care (Signed)
Immediate Anesthesia Transfer of Care Note  Patient: Emily Franco  Procedure(s) Performed: Procedure(s): OPEN REDUCTION INTERNAL FIXATION (ORIF) RIGHT 5TH METATARSAL FRACTURE (Right)  Patient Location: PACU  Anesthesia Type:General  Level of Consciousness: awake and alert   Airway & Oxygen Therapy: Patient Spontanous Breathing  Post-op Assessment: Report given to PACU RN and Post -op Vital signs reviewed and stable  Post vital signs: Reviewed and stable  Complications: No apparent anesthesia complications

## 2014-11-18 ENCOUNTER — Encounter (HOSPITAL_COMMUNITY): Payer: Self-pay | Admitting: Orthopaedic Surgery

## 2015-01-06 ENCOUNTER — Emergency Department (HOSPITAL_BASED_OUTPATIENT_CLINIC_OR_DEPARTMENT_OTHER)
Admission: EM | Admit: 2015-01-06 | Discharge: 2015-01-06 | Disposition: A | Discharge status: Home / Self Care | Payer: No Typology Code available for payment source | Attending: Emergency Medicine | Admitting: Emergency Medicine

## 2015-01-06 ENCOUNTER — Encounter (HOSPITAL_BASED_OUTPATIENT_CLINIC_OR_DEPARTMENT_OTHER): Payer: Self-pay | Admitting: *Deleted

## 2015-01-06 ENCOUNTER — Emergency Department (HOSPITAL_BASED_OUTPATIENT_CLINIC_OR_DEPARTMENT_OTHER): Payer: No Typology Code available for payment source

## 2015-01-06 DIAGNOSIS — Z792 Long term (current) use of antibiotics: Secondary | ICD-10-CM | POA: Insufficient documentation

## 2015-01-06 DIAGNOSIS — F419 Anxiety disorder, unspecified: Secondary | ICD-10-CM | POA: Diagnosis not present

## 2015-01-06 DIAGNOSIS — Z8632 Personal history of gestational diabetes: Secondary | ICD-10-CM | POA: Diagnosis not present

## 2015-01-06 DIAGNOSIS — I10 Essential (primary) hypertension: Secondary | ICD-10-CM | POA: Insufficient documentation

## 2015-01-06 DIAGNOSIS — S8991XA Unspecified injury of right lower leg, initial encounter: Secondary | ICD-10-CM | POA: Insufficient documentation

## 2015-01-06 DIAGNOSIS — Z791 Long term (current) use of non-steroidal anti-inflammatories (NSAID): Secondary | ICD-10-CM | POA: Diagnosis not present

## 2015-01-06 DIAGNOSIS — Y9389 Activity, other specified: Secondary | ICD-10-CM | POA: Insufficient documentation

## 2015-01-06 DIAGNOSIS — S161XXA Strain of muscle, fascia and tendon at neck level, initial encounter: Secondary | ICD-10-CM | POA: Diagnosis not present

## 2015-01-06 DIAGNOSIS — E785 Hyperlipidemia, unspecified: Secondary | ICD-10-CM | POA: Insufficient documentation

## 2015-01-06 DIAGNOSIS — S8992XA Unspecified injury of left lower leg, initial encounter: Secondary | ICD-10-CM | POA: Diagnosis not present

## 2015-01-06 DIAGNOSIS — Z79899 Other long term (current) drug therapy: Secondary | ICD-10-CM | POA: Diagnosis not present

## 2015-01-06 DIAGNOSIS — Y92410 Unspecified street and highway as the place of occurrence of the external cause: Secondary | ICD-10-CM | POA: Insufficient documentation

## 2015-01-06 DIAGNOSIS — E669 Obesity, unspecified: Secondary | ICD-10-CM | POA: Diagnosis not present

## 2015-01-06 DIAGNOSIS — Z7982 Long term (current) use of aspirin: Secondary | ICD-10-CM | POA: Diagnosis not present

## 2015-01-06 DIAGNOSIS — S99921A Unspecified injury of right foot, initial encounter: Secondary | ICD-10-CM | POA: Diagnosis not present

## 2015-01-06 DIAGNOSIS — S199XXA Unspecified injury of neck, initial encounter: Secondary | ICD-10-CM | POA: Diagnosis present

## 2015-01-06 DIAGNOSIS — F329 Major depressive disorder, single episode, unspecified: Secondary | ICD-10-CM | POA: Diagnosis not present

## 2015-01-06 DIAGNOSIS — Y998 Other external cause status: Secondary | ICD-10-CM | POA: Diagnosis not present

## 2015-01-06 DIAGNOSIS — M79671 Pain in right foot: Secondary | ICD-10-CM

## 2015-01-06 DIAGNOSIS — M25562 Pain in left knee: Secondary | ICD-10-CM

## 2015-01-06 DIAGNOSIS — M25561 Pain in right knee: Secondary | ICD-10-CM

## 2015-01-06 MED ORDER — HYDROCODONE-ACETAMINOPHEN 5-325 MG PO TABS
1.0000 | ORAL_TABLET | ORAL | Status: DC | PRN
Start: 2015-01-06 — End: 2015-12-30

## 2015-01-06 MED ORDER — HYDROCOD POLST-CHLORPHEN POLST 10-8 MG/5ML PO LQCR
5.0000 mL | Freq: Once | ORAL | Status: AC
  Administered 2015-01-06: 5 mL via ORAL
  Filled 2015-01-06: qty 5
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
## 2015-01-06 NOTE — ED Notes (Signed)
Patient transported to X-ray 
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
## 2015-01-13 ENCOUNTER — Ambulatory Visit: Payer: No Typology Code available for payment source | Attending: Orthopaedic Surgery | Admitting: Rehabilitation

## 2015-02-16 ENCOUNTER — Encounter: Payer: Self-pay | Admitting: Physician Assistant

## 2015-02-16 ENCOUNTER — Ambulatory Visit (INDEPENDENT_AMBULATORY_CARE_PROVIDER_SITE_OTHER): Payer: Medicaid Other | Admitting: Physician Assistant

## 2015-02-16 VITALS — BP 134/96 | HR 64 | Temp 98.5°F | Resp 18 | Wt 230.0 lb

## 2015-02-16 DIAGNOSIS — F329 Major depressive disorder, single episode, unspecified: Secondary | ICD-10-CM

## 2015-02-16 DIAGNOSIS — Z8249 Family history of ischemic heart disease and other diseases of the circulatory system: Secondary | ICD-10-CM

## 2015-02-16 DIAGNOSIS — M545 Low back pain, unspecified: Secondary | ICD-10-CM

## 2015-02-16 DIAGNOSIS — E669 Obesity, unspecified: Secondary | ICD-10-CM | POA: Diagnosis not present

## 2015-02-16 DIAGNOSIS — E785 Hyperlipidemia, unspecified: Secondary | ICD-10-CM | POA: Diagnosis not present

## 2015-02-16 DIAGNOSIS — F32A Depression, unspecified: Secondary | ICD-10-CM

## 2015-02-16 MED ORDER — CYCLOBENZAPRINE HCL 10 MG PO TABS: 10.0000 mg | ORAL_TABLET | Freq: Three times a day (TID) | ORAL | Status: DC | PRN
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
## 2015-02-17 LAB — LIPID PANEL
Cholesterol: 211 mg/dL — ABNORMAL HIGH (ref 0–200)
HDL: 46 mg/dL (ref >=46)
LDL Cholesterol: 150 mg/dL — ABNORMAL HIGH (ref 0–99)
Total CHOL/HDL Ratio: 4.6 Ratio
Triglycerides: 74 mg/dL (ref <150)
VLDL: 15 mg/dL (ref 0–40)

## 2015-02-17 LAB — COMPLETE METABOLIC PANEL WITH GFR
ALT: 15 U/L (ref 0–35)
AST: 16 U/L (ref 0–37)
Albumin: 3.7 g/dL (ref 3.5–5.2)
Alkaline Phosphatase: 125 U/L — ABNORMAL HIGH (ref 39–117)
BILIRUBIN TOTAL: 0.3 mg/dL (ref 0.2–1.2)
BUN: 13 mg/dL (ref 6–23)
CO2: 28 mEq/L (ref 19–32)
CREATININE: 0.59 mg/dL (ref 0.50–1.10)
Calcium: 9.3 mg/dL (ref 8.4–10.5)
Chloride: 104 mEq/L (ref 96–112)
GFR, Est African American: >89 mL/min
GFR, Est Non African American: >89 mL/min
Glucose, Bld: 76 mg/dL (ref 70–99)
Potassium: 4.7 mEq/L (ref 3.5–5.3)
Sodium: 140 mEq/L (ref 135–145)
Total Protein: 6.6 g/dL (ref 6.0–8.3)

## 2015-02-18 ENCOUNTER — Ambulatory Visit
Admission: RE | Admit: 2015-02-18 | Discharge: 2015-02-18 | Disposition: A | Discharge status: Home / Self Care | Payer: Medicaid Other | Source: Ambulatory Visit | Attending: Physician Assistant | Admitting: Physician Assistant

## 2015-02-18 DIAGNOSIS — M545 Low back pain, unspecified: Secondary | ICD-10-CM

## 2015-02-20 ENCOUNTER — Encounter: Payer: Self-pay | Admitting: Family Medicine

## 2015-02-24 ENCOUNTER — Other Ambulatory Visit: Payer: Self-pay | Admitting: Family Medicine

## 2015-03-14 ENCOUNTER — Other Ambulatory Visit: Payer: Self-pay | Admitting: Family Medicine

## 2015-04-06 ENCOUNTER — Telehealth: Payer: Self-pay | Admitting: Family Medicine

## 2015-04-06 ENCOUNTER — Other Ambulatory Visit: Payer: Self-pay | Admitting: Family Medicine

## 2015-04-06 DIAGNOSIS — E785 Hyperlipidemia, unspecified: Secondary | ICD-10-CM

## 2015-04-06 DIAGNOSIS — Z79899 Other long term (current) drug therapy: Secondary | ICD-10-CM

## 2015-04-06 MED ORDER — SIMVASTATIN 10 MG PO TABS: 10.0000 mg | ORAL_TABLET | Freq: Every day | ORAL | Status: DC

## 2015-04-06 NOTE — Telephone Encounter (Signed)
Pt calling about lab and xray results from last month.  Sent letter because unable to reach.  Results given.  [facility].  States has just lost her insurance.  Need repeat labs in 6 weeks.  Pt understands

## 2015-08-27 ENCOUNTER — Encounter (HOSPITAL_BASED_OUTPATIENT_CLINIC_OR_DEPARTMENT_OTHER): Payer: Self-pay | Admitting: Adult Health

## 2015-08-27 ENCOUNTER — Emergency Department (HOSPITAL_BASED_OUTPATIENT_CLINIC_OR_DEPARTMENT_OTHER)
Admission: EM | Admit: 2015-08-27 | Discharge: 2015-08-27 | Disposition: A | Discharge status: Home / Self Care | Payer: Self-pay | Attending: Emergency Medicine | Admitting: Emergency Medicine

## 2015-08-27 DIAGNOSIS — F329 Major depressive disorder, single episode, unspecified: Secondary | ICD-10-CM | POA: Insufficient documentation

## 2015-08-27 DIAGNOSIS — Z203 Contact with and (suspected) exposure to rabies: Secondary | ICD-10-CM | POA: Insufficient documentation

## 2015-08-27 DIAGNOSIS — Z2089 Contact with and (suspected) exposure to other communicable diseases: Secondary | ICD-10-CM

## 2015-08-27 DIAGNOSIS — E669 Obesity, unspecified: Secondary | ICD-10-CM | POA: Insufficient documentation

## 2015-08-27 DIAGNOSIS — F419 Anxiety disorder, unspecified: Secondary | ICD-10-CM | POA: Insufficient documentation

## 2015-08-27 DIAGNOSIS — E785 Hyperlipidemia, unspecified: Secondary | ICD-10-CM | POA: Insufficient documentation

## 2015-08-27 DIAGNOSIS — Z79899 Other long term (current) drug therapy: Secondary | ICD-10-CM | POA: Insufficient documentation

## 2015-08-27 DIAGNOSIS — Z207 Contact with and (suspected) exposure to pediculosis, acariasis and other infestations: Secondary | ICD-10-CM

## 2015-08-27 DIAGNOSIS — Z8632 Personal history of gestational diabetes: Secondary | ICD-10-CM | POA: Insufficient documentation

## 2015-08-27 DIAGNOSIS — I10 Essential (primary) hypertension: Secondary | ICD-10-CM | POA: Insufficient documentation

## 2015-08-27 MED ORDER — PERMETHRIN 5 % EX CREA: TOPICAL_CREAM | CUTANEOUS | Status: DC
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
## 2015-08-27 NOTE — ED Provider Notes (Signed)
CSN: [id number]     Arrival date & time 08/27/15  0220 History   First MD Initiated Contact with Patient 08/27/15 0249     Chief Complaint  Patient presents with  . Rash     (Consider location/radiation/quality/duration/timing/severity/associated sxs/prior Treatment) Patient is a 44 y.o. female presenting with rash. The history is provided by the patient.  Rash Location:  Torso and shoulder/arm Shoulder/arm rash location:  L arm and R arm (wrists and antecubital fossa) Torso rash location:  Abd LLQ and abd RLQ Quality: itchiness   Quality: not blistering   Severity:  Moderate Onset quality:  Sudden Timing:  Constant Progression:  Worsening Chronicity:  New Context comment:  Exposure to someone with scabies Relieved by:  Nothing Worsened by:  Nothing tried Ineffective treatments:  None tried Associated symptoms: no abdominal pain, no throat swelling and no tongue swelling     Past Medical History  Diagnosis Date  . Hypertension   . Depression   . Hyperlipidemia   . Obesity   . Diabetes mellitus without complication     gestational  . Anxiety     history of not lately   Past Surgical History  Procedure Laterality Date  . Wisdom tooth extraction      "laughing gas"  . Orif toe fracture Right 11/17/2014    Procedure: OPEN REDUCTION INTERNAL FIXATION (ORIF) RIGHT 5TH METATARSAL FRACTURE;  Surgeon: Cheral Almas, MD;  Location: [facility] OR;  Service: Orthopedics;  Laterality: Right;   Family History  Problem Relation Age of Onset  . Heart disease Father 63    CABG @ 34 y/o  . Heart disease Brother 15    CAD @ 41 y/o   Social History  Substance Use Topics  . Smoking status: Never Smoker   . Smokeless tobacco: None  . Alcohol Use: 2.4 oz/week    4 Glasses of wine per week     Comment: occasional   OB History    No data available     Review of Systems  Gastrointestinal: Negative for abdominal pain.  Skin: Positive for rash.  All other systems reviewed and are  negative.     Allergies  Review of patient's allergies indicates no known allergies.  Home Medications   Prior to Admission medications   Medication Sig Start Date End Date Taking? Authorizing Provider  Acetaminophen-Caffeine (EXCEDRIN TENSION HEADACHE PO) Take by mouth.    Historical Provider, MD  aspirin EC 325 MG tablet Take 1 tablet (325 mg total) by mouth 2 (two) times daily. Patient not taking: Reported on 02/16/2015 11/17/14   Tarry Kos, MD  cetirizine (ZYRTEC) 10 MG tablet TAKE 1 TABLET (10 MG TOTAL) BY MOUTH DAILY. 02/24/15   Patriciaann Clan Dixon, PA-C  citalopram (CELEXA) 20 MG tablet TAKE 1 TABLET (20 MG TOTAL) BY MOUTH DAILY. 03/16/15   Patriciaann Clan Dixon, PA-C  cyclobenzaprine (FLEXERIL) 10 MG tablet Take 1 tablet (10 mg total) by mouth 3 (three) times daily as needed for muscle spasms. 02/16/15   Dorena Bodo, PA-C  HYDROcodone-acetaminophen (NORCO/VICODIN) 5-325 MG per tablet Take 1-2 tablets by mouth every 4 (four) hours as needed. Patient not taking: Reported on 02/16/2015 01/06/15   Teressa Lower, NP  ibuprofen (ADVIL,MOTRIN) 200 MG tablet Take 200 mg by mouth every 6 (six) hours as needed.    Historical Provider, MD  naproxen (NAPROSYN) 500 MG tablet Take 1 tablet (500 mg total) by mouth 2 (two) times daily. Patient not taking: Reported on 02/16/2015 11/04/14  Vanetta Mulders, MD  permethrin (ELIMITE) 5 % cream Apply to affected area once leave on for 8-14 hours and wash off thoroughly 08/27/15   Deepak Bless, MD  simvastatin (ZOCOR) 10 MG tablet Take 1 tablet (10 mg total) by mouth at bedtime. 04/06/15   Mary B Dixon, PA-C   BP 125/90 mmHg  Pulse 67  Temp(Src) 98.9 F (37.2 C) (Oral)  Resp 18  SpO2 100% Physical Exam  Constitutional: She is oriented to person, place, and time. She appears well-developed and well-nourished. No distress.  HENT:  Head: Normocephalic and atraumatic.  Mouth/Throat: Oropharynx is clear and moist.  Eyes: Conjunctivae and EOM are normal. Pupils are equal,  round, and reactive to light.  Neck: Normal range of motion. Neck supple.  Cardiovascular: Normal rate, regular rhythm and intact distal pulses.   Pulmonary/Chest: Effort normal and breath sounds normal. No stridor. No respiratory distress. She has no wheezes. She has no rales.  Abdominal: Soft. Bowel sounds are normal. There is no tenderness. There is no rebound and no guarding.  Musculoskeletal: Normal range of motion.  Neurological: She is alert and oriented to person, place, and time.  Skin: Skin is warm and dry.  Punctate scabbed lesions, no burrows on volar wrists and antecubital fossa and a few on the lower abdomen    ED Course  Procedures (including critical care time) Labs Review Labs Reviewed - No data to display  Imaging Review No results found. I have personally reviewed and evaluated these images and lab results as part of my medical decision-making.   EKG Interpretation None      MDM   Final diagnoses:  Scabies exposure    Will treat with a course of permethrin given lesions and scabies exposure.  Follow up with your PMD for recheck in 7 days    Teigen Bellin, MD 08/27/15 1610

## 2015-10-28 ENCOUNTER — Encounter: Payer: Self-pay | Admitting: Family Medicine

## 2015-10-28 ENCOUNTER — Other Ambulatory Visit: Payer: Self-pay | Admitting: Physician Assistant

## 2015-10-28 NOTE — Telephone Encounter (Signed)
Medication refill for one time only.  Patient needs to be seen.  Letter sent for patient to call and schedule 

## 2015-12-24 ENCOUNTER — Ambulatory Visit: Payer: Medicaid Other | Admitting: Physician Assistant

## 2015-12-30 ENCOUNTER — Ambulatory Visit (INDEPENDENT_AMBULATORY_CARE_PROVIDER_SITE_OTHER): Payer: Self-pay | Admitting: Physician Assistant

## 2015-12-30 ENCOUNTER — Encounter: Payer: Self-pay | Admitting: Physician Assistant

## 2015-12-30 VITALS — BP 134/96 | HR 68 | Temp 97.8°F | Resp 18 | Wt 203.0 lb

## 2015-12-30 DIAGNOSIS — E785 Hyperlipidemia, unspecified: Secondary | ICD-10-CM

## 2015-12-30 DIAGNOSIS — F32A Depression, unspecified: Secondary | ICD-10-CM

## 2015-12-30 DIAGNOSIS — E669 Obesity, unspecified: Secondary | ICD-10-CM

## 2015-12-30 DIAGNOSIS — I10 Essential (primary) hypertension: Secondary | ICD-10-CM

## 2015-12-30 DIAGNOSIS — Z8249 Family history of ischemic heart disease and other diseases of the circulatory system: Secondary | ICD-10-CM

## 2015-12-30 DIAGNOSIS — F329 Major depressive disorder, single episode, unspecified: Secondary | ICD-10-CM

## 2015-12-30 MED ORDER — BENAZEPRIL HCL 10 MG PO TABS: 10.0000 mg | ORAL_TABLET | Freq: Every day | ORAL | Status: DC

## 2015-12-30 MED ORDER — CITALOPRAM HYDROBROMIDE 20 MG PO TABS: 20.0000 mg | ORAL_TABLET | Freq: Every day | ORAL | Status: DC

## 2015-12-30 NOTE — Progress Notes (Signed)
Patient ID: Emily Franco MRN: [id number], DOB: 03/01/1971, 45 y.o. Date of Encounter: @  Chief Complaint:  Chief Complaint  Patient presents with  . Medication Refill    celexa, zyrtec    HPI: 45 y.o. year old white obese female  presents for routine follow-up and medication refill.     She states that the Celexa is working well as far as controlling her anxiety and depression symptoms. It causes no adverse effects.  I reviewed with her that when we checked labs 02/16/15 lipid panel showed LDL 150 and at that time I have recommended for her to start simvastatin 10 mg and then recheck labs 6 weeks. Today patient states that she never did start the cholesterol medicine at that time. Says that she is on no cholesterol medicine. She is not fasting today but says that she will return fasting.  Today I reviewed with her that her weight has actually decreased quite a bit. 02/16/15 was 230 pounds. Today 203 pounds. She says that she did not realize that she had lost that much weight. Says a lot of it is that she is a lot more active at her current job. Also says that she is drinking a lot of water and has pretty much cut out sodas. Also says that she does try to do better regarding healthier foods.  Even with physical exertion, is having no angina symptoms.   Past Medical History  Diagnosis Date  . Hypertension   . Depression   . Hyperlipidemia   . Obesity   . Diabetes mellitus without complication (HCC)     gestational  . Anxiety     history of not lately     Home Meds: Outpatient Prescriptions Prior to Visit  Medication Sig Dispense Refill  . Acetaminophen-Caffeine (EXCEDRIN TENSION HEADACHE PO) Take by mouth.    . cetirizine (ZYRTEC) 10 MG tablet TAKE 1 TABLET (10 MG TOTAL) BY MOUTH DAILY. 30 tablet 2  . ibuprofen (ADVIL,MOTRIN) 200 MG tablet Take 200 mg by mouth every 6 (six) hours as needed.    . citalopram (CELEXA) 20 MG tablet TAKE 1 TABLET BY MOUTH DAILY 30  tablet 0  . aspirin EC 325 MG tablet Take 1 tablet (325 mg total) by mouth 2 (two) times daily. (Patient not taking: Reported on 02/16/2015) 84 tablet 0  . cyclobenzaprine (FLEXERIL) 10 MG tablet Take 1 tablet (10 mg total) by mouth 3 (three) times daily as needed for muscle spasms. (Patient not taking: Reported on 12/30/2015) 30 tablet 0  . HYDROcodone-acetaminophen (NORCO/VICODIN) 5-325 MG per tablet Take 1-2 tablets by mouth every 4 (four) hours as needed. (Patient not taking: Reported on 02/16/2015) 15 tablet 0  . naproxen (NAPROSYN) 500 MG tablet Take 1 tablet (500 mg total) by mouth 2 (two) times daily. (Patient not taking: Reported on 02/16/2015) 14 tablet 0  . permethrin (ELIMITE) 5 % cream Apply to affected area once leave on for 8-14 hours and wash off thoroughly 60 g 0  . simvastatin (ZOCOR) 10 MG tablet Take 1 tablet (10 mg total) by mouth at bedtime. (Patient not taking: Reported on 12/30/2015) 90 tablet 0   No facility-administered medications prior to visit.    Allergies: No Known Allergies  Social History   Social History  . Marital Status: Divorced    Spouse Name: N/A  . Number of Children: N/A  . Years of Education: N/A   Occupational History  . Not on file.   Social History Main  Topics  . Smoking status: Never Smoker   . Smokeless tobacco: Not on file  . Alcohol Use: 2.4 oz/week    4 Glasses of wine per week     Comment: occasional  . Drug Use: Yes    Special: Marijuana  . Sexual Activity: Not on file   Other Topics Concern  . Not on file   Social History Narrative    Family History  Problem Relation Age of Onset  . Heart disease Father 35    CABG @ 24 y/o  . Heart disease Brother 33    CAD @ 63 y/o     Review of Systems:  See HPI for pertinent ROS. All other ROS negative.    Physical Exam: Blood pressure 134/96, pulse 68, temperature 97.8 F (36.6 C), temperature source Oral, resp. rate 18, weight 203 lb (92.08 kg)., Body mass index is 35.97  kg/(m^2). General:Obese WF.  Appears in no acute distress. Neck: Supple. No thyromegaly. No lymphadenopathy. no carotid bruit. Lungs: Clear bilaterally to auscultation without wheezes, rales, or rhonchi. Breathing is unlabored. Heart: RRR with S1 S2. No murmurs, rubs, or gallops. Abdomen: Soft, non-tender, non-distended with normoactive bowel sounds. No hepatomegaly. No rebound/guarding. No obvious abdominal masses. Musculoskeletal:  Strength and tone normal for age.  Extremities/Skin: Warm and dry.  No edema. Neuro: Alert and oriented X 3. Moves all extremities spontaneously. Gait is normal. CNII-XII grossly in tact. Psych:  Responds to questions appropriately with a normal affect.     ASSESSMENT AND PLAN:  45 y.o. year old female with   1. Depression Stable/controlled. Continue current medication with Celexa 20 mg daily. - citalopram (CELEXA) 20 MG tablet; Take 1 tablet (20 mg total) by mouth daily.  Dispense: 90 tablet; Refill: 3  2. Hyperlipidemia She is not on lipid medication currently. She is to return fasting to recheck lipid panel and then determine future treatment. - Lipid panel; Future - Hepatic function panel; Future  3. Obesity She has lost significant weight. 02/2015 with 230 pounds and today 203. Continue the increased physical activity and diet changes.  4. Family history of coronary artery disease She has a very significant family history of Premature CAD. Needs aggressive risk factor reduction. Therefore starting blood pressure medicine and following up hyperlipidemia. As well she needs to continue diet and exercise to control her weight.  5. Essential hypertension I which she checks blood pressure myself today and get 144/104 on the right. Blood pressure check today 134/96. Also at visit 02/16/15 blood pressure reading was 134/96. She needs to start low-dose blood pressure medication. She is to take benazepril 10 mg daily. Return for follow-up visit in 2 weeks to  recheck blood pressure and BMET on medication. She can come fasting to that same appointment and we can recheck FLP and LFT at that same time. - benazepril (LOTENSIN) 10 MG tablet; Take 1 tablet (10 mg total) by mouth daily.  Dispense: 30 tablet; Refill: 0  Follow-up office visit 2 weeks--fasting-- to recheck blood pressure and also check CMET and FLP.   [address] [place], [place], [facility] 12/30/2015 1:26 PM

## 2016-01-13 ENCOUNTER — Ambulatory Visit: Payer: Self-pay | Admitting: Physician Assistant

## 2016-01-18 NOTE — Progress Notes (Signed)
Showing up in over due results folder for FLP and LFTs. Call patient remind her to come fasting for labs.

## 2017-01-18 ENCOUNTER — Other Ambulatory Visit: Payer: Self-pay | Admitting: Physician Assistant

## 2017-01-18 DIAGNOSIS — F329 Major depressive disorder, single episode, unspecified: Secondary | ICD-10-CM

## 2017-01-18 DIAGNOSIS — F32A Depression, unspecified: Secondary | ICD-10-CM

## 2017-01-18 NOTE — Telephone Encounter (Signed)
Last Ov 12-30-2015 Last refill 12-30-2015 Okay to refill?

## 2017-01-18 NOTE — Telephone Encounter (Signed)
Pt will be seen in Office 2-12

## 2017-01-18 NOTE — Telephone Encounter (Signed)
NTBS. Schedule OV. 

## 2017-01-23 ENCOUNTER — Encounter: Payer: Self-pay | Admitting: Physician Assistant

## 2017-01-23 ENCOUNTER — Ambulatory Visit (INDEPENDENT_AMBULATORY_CARE_PROVIDER_SITE_OTHER): Payer: BLUE CROSS/BLUE SHIELD | Admitting: Physician Assistant

## 2017-01-23 VITALS — BP 140/90 | HR 67 | Temp 97.4°F | Resp 16 | Wt 211.4 lb

## 2017-01-23 DIAGNOSIS — Z6837 Body mass index (BMI) 37.0-37.9, adult: Secondary | ICD-10-CM | POA: Diagnosis not present

## 2017-01-23 DIAGNOSIS — J301 Allergic rhinitis due to pollen: Secondary | ICD-10-CM | POA: Diagnosis not present

## 2017-01-23 DIAGNOSIS — E785 Hyperlipidemia, unspecified: Secondary | ICD-10-CM

## 2017-01-23 DIAGNOSIS — F325 Major depressive disorder, single episode, in full remission: Secondary | ICD-10-CM

## 2017-01-23 DIAGNOSIS — F320 Major depressive disorder, single episode, mild: Secondary | ICD-10-CM

## 2017-01-23 DIAGNOSIS — Z8249 Family history of ischemic heart disease and other diseases of the circulatory system: Secondary | ICD-10-CM | POA: Diagnosis not present

## 2017-01-23 DIAGNOSIS — I10 Essential (primary) hypertension: Secondary | ICD-10-CM

## 2017-01-23 MED ORDER — CETIRIZINE HCL 10 MG PO TABS: ORAL_TABLET | ORAL | 2 refills | Status: DC

## 2017-01-23 MED ORDER — CITALOPRAM HYDROBROMIDE 20 MG PO TABS: 20.0000 mg | ORAL_TABLET | Freq: Every day | ORAL | 3 refills | Status: DC

## 2017-01-23 NOTE — Progress Notes (Signed)
Patient ID: Emily PalauJacqueline R Liuzzi MRN: [id number], DOB: 12/27/1970, 46 y.o. Date of Encounter: @DATE @  Chief Complaint:  Chief Complaint  Patient presents with  . depression medication refill    HPI: 46 y.o. year old white obese female  presents for routine follow-up and medication refill.   12/30/2015:  She states that the Celexa is working well as far as controlling her anxiety and depression symptoms. It causes no adverse effects.  I reviewed with her that when we checked labs 02/16/15 lipid panel showed LDL 150 and at that time I have recommended for her to start simvastatin 10 mg and then recheck labs 6 weeks. Today patient states that she never did start the cholesterol medicine at that time. Says that she is on no cholesterol medicine. She is not fasting today but says that she will return fasting.  Today I reviewed with her that her weight has actually decreased quite a bit. 02/16/15 was 230 pounds. Today 203 pounds. She says that she did not realize that she had lost that much weight. Says a lot of it is that she is a lot more active at her current job. Also says that she is drinking a lot of water and has pretty much cut out sodas. Also says that she does try to do better regarding healthier foods.  Even with physical exertion, is having no angina symptoms.    01/23/2017: She is on no lipid med.  She is on no BP med.  Has not had BP checked anywhere else to know how it has been running.  She is taking the Celexa. Says this is definitely working. Causes no adverse effects.  Needs med for allergies.  Says she was without insurance for a while but now has insurance so agreeable to return for f/u.  Rec return fasting to check labs this week and retrun for f/u OV in ~ 2 weeks to recheck BP since it is borderline today. She is agreeable.  Also she is asking about her weight today compared to prior weights. Says she wants to get < 200.  Past Medical History:  Diagnosis Date  .  Anxiety    history of not lately  . Depression   . Diabetes mellitus without complication (HCC)    gestational  . Hyperlipidemia   . Hypertension   . Obesity      Home Meds: Outpatient Medications Prior to Visit  Medication Sig Dispense Refill  . Acetaminophen-Caffeine (EXCEDRIN TENSION HEADACHE PO) Take by mouth.    . benazepril (LOTENSIN) 10 MG tablet Take 1 tablet (10 mg total) by mouth daily. 30 tablet 0  . cetirizine (ZYRTEC) 10 MG tablet TAKE 1 TABLET (10 MG TOTAL) BY MOUTH DAILY. 30 tablet 2  . citalopram (CELEXA) 20 MG tablet Take 1 tablet (20 mg total) by mouth daily. 90 tablet 3  . ibuprofen (ADVIL,MOTRIN) 200 MG tablet Take 200 mg by mouth every 6 (six) hours as needed.    Marland Kitchen. aspirin EC 325 MG tablet Take 1 tablet (325 mg total) by mouth 2 (two) times daily. (Patient not taking: Reported on 02/16/2015) 84 tablet 0   No facility-administered medications prior to visit.     Allergies: No Known Allergies  Social History   Social History  . Marital status: Divorced    Spouse name: N/A  . Number of children: N/A  . Years of education: N/A   Occupational History  . Not on file.   Social History Main Topics  .  Smoking status: Never Smoker  . Smokeless tobacco: Never Used  . Alcohol use 2.4 oz/week    4 Glasses of wine per week     Comment: occasional  . Drug use: Yes    Types: Marijuana  . Sexual activity: Not on file   Other Topics Concern  . Not on file   Social History Narrative  . No narrative on file    Family History  Problem Relation Age of Onset  . Heart disease Father 37    CABG @ 71 y/o  . Heart disease Brother 43    CAD @ 7 y/o     Review of Systems:  See HPI for pertinent ROS. All other ROS negative.    Physical Exam: Blood pressure 140/90, pulse 67, temperature 97.4 F (36.3 C), temperature source Oral, resp. rate 16, weight 211 lb 6.4 oz (95.9 kg), SpO2 98 %., Body mass index is 37.45 kg/m. General:Obese WF.  Appears in no acute  distress. Neck: Supple. No thyromegaly. No lymphadenopathy. no carotid bruit. Lungs: Clear bilaterally to auscultation without wheezes, rales, or rhonchi. Breathing is unlabored. Heart: RRR with S1 S2. No murmurs, rubs, or gallops. Abdomen: Soft, non-tender, non-distended with normoactive bowel sounds. No hepatomegaly. No rebound/guarding. No obvious abdominal masses. Musculoskeletal:  Strength and tone normal for age.  Extremities/Skin: Warm and dry.  No edema. Neuro: Alert and oriented X 3. Moves all extremities spontaneously. Gait is normal. CNII-XII grossly in tact. Psych:  Responds to questions appropriately with a normal affect.     ASSESSMENT AND PLAN:  46 y.o. year old female with   1. Depression Stable/controlled. Continue current medication with Celexa 20 mg daily. - citalopram (CELEXA) 20 MG tablet; Take 1 tablet (20 mg total) by mouth daily.  Dispense: 90 tablet; Refill: 3  2. Hyperlipidemia She is not on lipid medication currently. She is to return fasting to recheck lipid panel and then determine future treatment. - Lipid panel; Future - Hepatic function panel; Future  3. Obesity . 02/2015 was 230 pounds   12/2015---203.  01/2017---211   4. Family history of coronary artery disease She has a very significant family history of Premature CAD. Needs aggressive risk factor reduction. Therefore she is to return for FLP and f/u OV to recheck BP as it is borderline today. As well she needs to continue diet and exercise to control her weight and decrease her cardiovascular risk.  5. Essential hypertension She was prescribed BP med at [facility] but then never had f/u.  BP borderline today.  To return for f/u OV 2 weeks.   6. Acute seasonal allergic rhinitis due to pollen - cetirizine (ZYRTEC) 10 MG tablet; TAKE 1 TABLET (10 MG TOTAL) BY MOUTH DAILY.  Dispense: 30 tablet; Refill: 2    Signed, [address] [place], [place], [facility] 01/23/2017 3:45 PM

## 2017-02-06 ENCOUNTER — Telehealth: Payer: Self-pay | Admitting: Physician Assistant

## 2017-02-06 ENCOUNTER — Ambulatory Visit: Payer: BLUE CROSS/BLUE SHIELD | Admitting: Physician Assistant

## 2017-02-06 DIAGNOSIS — F320 Major depressive disorder, single episode, mild: Secondary | ICD-10-CM

## 2017-02-06 NOTE — Telephone Encounter (Signed)
Pls  see note and confirm if it is ok to refill  Refill was given on 2-12

## 2017-02-06 NOTE — Telephone Encounter (Signed)
Patient has lost her celexa could we give her another refill   [phone]

## 2017-02-06 NOTE — Telephone Encounter (Signed)
This Rx was a 90 day supply and pt states she can not go that long w/o the medication she states she has a problem with leaving her pocketbook open and things falling out.  Pt is asking if a different Rx can be called in

## 2017-02-06 NOTE — Telephone Encounter (Signed)
Insurance  may not cover it that can send in refill.Approved.

## 2017-02-06 NOTE — Telephone Encounter (Signed)
No, We cannot just up and change to a different medicine. Can send prescription for #90.

## 2017-02-06 NOTE — Telephone Encounter (Signed)
Spoke with pt she is aware.  

## 2017-03-14 ENCOUNTER — Other Ambulatory Visit: Payer: Self-pay

## 2017-03-14 DIAGNOSIS — J301 Allergic rhinitis due to pollen: Secondary | ICD-10-CM

## 2017-03-14 MED ORDER — CETIRIZINE HCL 10 MG PO TABS: ORAL_TABLET | ORAL | 0 refills | Status: DC

## 2017-03-14 NOTE — Telephone Encounter (Signed)
Refill appropriate 

## 2017-03-22 ENCOUNTER — Other Ambulatory Visit: Payer: Self-pay

## 2017-03-22 DIAGNOSIS — J301 Allergic rhinitis due to pollen: Secondary | ICD-10-CM

## 2017-06-15 ENCOUNTER — Encounter: Payer: Self-pay | Admitting: Physician Assistant

## 2017-07-20 ENCOUNTER — Encounter: Payer: Self-pay | Admitting: Physician Assistant

## 2017-07-29 ENCOUNTER — Emergency Department (HOSPITAL_BASED_OUTPATIENT_CLINIC_OR_DEPARTMENT_OTHER): Payer: BLUE CROSS/BLUE SHIELD

## 2017-07-29 ENCOUNTER — Emergency Department (HOSPITAL_BASED_OUTPATIENT_CLINIC_OR_DEPARTMENT_OTHER)
Admission: EM | Admit: 2017-07-29 | Discharge: 2017-07-29 | Disposition: A | Discharge status: Home / Self Care | Payer: BLUE CROSS/BLUE SHIELD | Attending: Emergency Medicine | Admitting: Emergency Medicine

## 2017-07-29 ENCOUNTER — Encounter (HOSPITAL_BASED_OUTPATIENT_CLINIC_OR_DEPARTMENT_OTHER): Payer: Self-pay

## 2017-07-29 DIAGNOSIS — Y9259 Other trade areas as the place of occurrence of the external cause: Secondary | ICD-10-CM | POA: Insufficient documentation

## 2017-07-29 DIAGNOSIS — S0003XA Contusion of scalp, initial encounter: Secondary | ICD-10-CM | POA: Diagnosis not present

## 2017-07-29 DIAGNOSIS — E119 Type 2 diabetes mellitus without complications: Secondary | ICD-10-CM | POA: Insufficient documentation

## 2017-07-29 DIAGNOSIS — Y9389 Activity, other specified: Secondary | ICD-10-CM | POA: Insufficient documentation

## 2017-07-29 DIAGNOSIS — I10 Essential (primary) hypertension: Secondary | ICD-10-CM | POA: Insufficient documentation

## 2017-07-29 DIAGNOSIS — S8992XA Unspecified injury of left lower leg, initial encounter: Secondary | ICD-10-CM | POA: Diagnosis present

## 2017-07-29 DIAGNOSIS — Z79899 Other long term (current) drug therapy: Secondary | ICD-10-CM | POA: Diagnosis not present

## 2017-07-29 DIAGNOSIS — S0990XA Unspecified injury of head, initial encounter: Secondary | ICD-10-CM | POA: Diagnosis not present

## 2017-07-29 DIAGNOSIS — Y999 Unspecified external cause status: Secondary | ICD-10-CM | POA: Insufficient documentation

## 2017-07-29 DIAGNOSIS — S8392XA Sprain of unspecified site of left knee, initial encounter: Secondary | ICD-10-CM | POA: Diagnosis not present

## 2017-07-29 DIAGNOSIS — Z7982 Long term (current) use of aspirin: Secondary | ICD-10-CM | POA: Insufficient documentation

## 2017-07-29 DIAGNOSIS — S86912A Strain of unspecified muscle(s) and tendon(s) at lower leg level, left leg, initial encounter: Secondary | ICD-10-CM

## 2017-07-29 DIAGNOSIS — W108XXA Fall (on) (from) other stairs and steps, initial encounter: Secondary | ICD-10-CM | POA: Insufficient documentation

## 2017-07-29 MED ORDER — IBUPROFEN 600 MG PO TABS: 600.0000 mg | ORAL_TABLET | Freq: Four times a day (QID) | ORAL | 0 refills | Status: DC | PRN

## 2017-07-29 NOTE — ED Notes (Signed)
Knee immobilizer placed on left knee, pt ambulated in hall prior to discharge and understood instructions for use

## 2017-07-29 NOTE — ED Notes (Signed)
ED Provider at bedside. 

## 2017-07-29 NOTE — ED Triage Notes (Signed)
Pt complains of leg pain from fall yesterday afternoon, fell backwards and hit her head. States pain did not start until today.

## 2017-07-29 NOTE — ED Provider Notes (Signed)
[facility] Note   CSN: [id number] Arrival date & time: 07/29/17  0753     History   Chief Complaint Chief Complaint  Patient presents with  . Leg Pain    Due to fall     HPI Emily Franco is a 46 y.o. female.  Pt presents to the ED today with headache and leg pain.  The pt was looking for a new car yesterday in the heat.  She felt nauseous and was walking up the stairs into the office at the car place.  She grabbed onto the stair handle.  It was loose, and she fell backwards.  The pt hit her head.  She had a large bump on the back of her head and some how twisted her left knee.  The knee did not hurt that much last night, but hurts this morning.  She is able to walk, but it hurts.  She has a headache and felt a little dizzy this morning.      Past Medical History:  Diagnosis Date  . Anxiety    history of not lately  . Depression   . Diabetes mellitus without complication (HCC)    gestational  . Hyperlipidemia   . Hypertension   . Obesity     Patient Active Problem List   Diagnosis Date Noted  . HTN (hypertension) 12/30/2015  . Family history of coronary artery disease 02/16/2015  . Low back pain 02/16/2015  . Depression   . Hyperlipidemia   . Obesity     Past Surgical History:  Procedure Laterality Date  . ORIF TOE FRACTURE Right 11/17/2014   Procedure: OPEN REDUCTION INTERNAL FIXATION (ORIF) RIGHT 5TH METATARSAL FRACTURE;  Surgeon: Cheral Almas, MD;  Location: [facility] OR;  Service: Orthopedics;  Laterality: Right;  . WISDOM TOOTH EXTRACTION     "laughing gas"    OB History    No data available       Home Medications    Prior to Admission medications   Medication Sig Start Date End Date Taking? Authorizing Provider  citalopram (CELEXA) 20 MG tablet Take 1 tablet (20 mg total) by mouth daily. 01/23/17  Yes Dorena Bodo, PA-C  Acetaminophen-Caffeine (EXCEDRIN TENSION HEADACHE PO) Take by mouth.    [provider]    aspirin EC 325 MG tablet Take 1 tablet (325 mg total) by mouth 2 (two) times daily. Patient not taking: Reported on 02/16/2015 11/17/14   Tarry Kos, MD  cetirizine (ZYRTEC) 10 MG tablet TAKE 1 TABLET (10 MG TOTAL) BY MOUTH DAILY. 03/14/17   Dorena Bodo, PA-C  ibuprofen (ADVIL,MOTRIN) 600 MG tablet Take 1 tablet (600 mg total) by mouth every 6 (six) hours as needed. 07/29/17   Jacalyn Lefevre, MD    Family History Family History  Problem Relation Age of Onset  . Heart disease Father 84       CABG @ 61 y/o  . Heart disease Brother 84       CAD @ 85 y/o    Social History Social History  Substance Use Topics  . Smoking status: Never Smoker  . Smokeless tobacco: Never Used  . Alcohol use 2.4 oz/week    4 Glasses of wine per week     Comment: occasional     Allergies   Patient has no known allergies.   Review of Systems Review of Systems  Musculoskeletal:       Left knee pain  Neurological: Positive for headaches.  All other systems reviewed and are negative.    Physical Exam Updated Vital Signs BP (!) 133/99 (BP Location: Left Arm)   Pulse 79   Temp 98.6 F (37 C) (Oral)   Resp 18   Ht 5\' 2"  (1.575 m)   Wt 102.1 kg (225 lb)   SpO2 97%   BMI 41.15 kg/m   Physical Exam  Constitutional: She is oriented to person, place, and time. She appears well-developed and well-nourished.  HENT:  Head: Normocephalic.    Right Ear: External ear normal.  Left Ear: External ear normal.  Nose: Nose normal.  Mouth/Throat: Oropharynx is clear and moist.  Eyes: Pupils are equal, round, and reactive to light. Conjunctivae and EOM are normal.  Neck: Normal range of motion. Neck supple.  Cardiovascular: Normal rate, regular rhythm, normal heart sounds and intact distal pulses.   Pulmonary/Chest: Effort normal and breath sounds normal.  Abdominal: Soft. Bowel sounds are normal.  Musculoskeletal:       Left knee: Tenderness found.  Neurological: She is alert and oriented to  person, place, and time.  Skin: Skin is warm and dry.  Psychiatric: She has a normal mood and affect. Her behavior is normal. Thought content normal.  Nursing note and vitals reviewed.    ED Treatments / Results  Labs (all labs ordered are listed, but only abnormal results are displayed) Labs Reviewed - No data to display  EKG  EKG Interpretation None       Radiology Ct Head Wo Contrast  Result Date: 07/29/2017 CLINICAL DATA:  Headache, posttraumatic. Fell backwards from stairs yesterday. Initial encounter. EXAM: CT HEAD WITHOUT CONTRAST TECHNIQUE: Contiguous axial images were obtained from the base of the skull through the vertex without intravenous contrast. COMPARISON:  None. FINDINGS: Brain: No evidence of acute infarction, hemorrhage, hydrocephalus, or masslike finding. Prominent extra-axial CSF at the vertex without mass effect to suggest hygroma. Vascular: No hyperdense vessel or unexpected calcification. Skull: Right parietal scalp contusion without underlying fracture. 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 Sinuses/Orbits: No evidence of injury.  Mild left-sided sinusitis. IMPRESSION: 1. No evidence of acute intracranial injury. 2. Occipital scalp contusion without acute fracture. Electronically Signed   By: Marnee Spring M.D.   On: 07/29/2017 08:55   Dg Knee Complete 4 Views Left  Result Date: 07/29/2017 CLINICAL DATA:  46 year old female with a history of fall and left anterior knee pain EXAM: LEFT KNEE - COMPLETE 4+ VIEW COMPARISON:  01/06/2015 FINDINGS: No acute fracture identified. No radiopaque foreign body. No joint effusion. No significant soft tissue swelling. Linear ossific density overlying the medial femoral condyles was present on the comparison, either representing chronic changes or remote injury. Mild osteoarthritis. IMPRESSION: No acute bony abnormality. Mild osteoarthritis. Electronically Signed   By: Gilmer Mor  D.O.   On: 07/29/2017 08:52    Procedures Procedures (including critical care time)  Medications Ordered in ED Medications - No data to display   Initial Impression / Assessment and Plan / ED Course  I have reviewed the triage vital signs and the nursing notes.  Pertinent labs & imaging results that were available during my care of the patient were reviewed by me and considered in my medical decision making (see chart for details).     Pt did not want anything for pain while here.  She will be d/c home with a knee immobilizer.  She is given the number to ortho.  She is given  a note for work.  She knows to return if worse and to f/u with pcp.  Final Clinical Impressions(s) / ED Diagnoses   Final diagnoses:  Knee strain, left, initial encounter  Contusion of scalp, initial encounter    New Prescriptions Current Discharge Medication List       Jacalyn Lefevre, MD 07/29/17 [phone]

## 2017-09-05 ENCOUNTER — Other Ambulatory Visit: Payer: Self-pay | Admitting: Physician Assistant

## 2017-09-05 DIAGNOSIS — J301 Allergic rhinitis due to pollen: Secondary | ICD-10-CM

## 2017-10-02 ENCOUNTER — Other Ambulatory Visit: Payer: Self-pay

## 2017-10-02 DIAGNOSIS — J301 Allergic rhinitis due to pollen: Secondary | ICD-10-CM

## 2017-10-02 MED ORDER — CETIRIZINE HCL 10 MG PO TABS: ORAL_TABLET | ORAL | 0 refills | Status: DC

## 2017-10-03 ENCOUNTER — Other Ambulatory Visit: Payer: Self-pay

## 2017-10-03 DIAGNOSIS — J301 Allergic rhinitis due to pollen: Secondary | ICD-10-CM

## 2017-10-03 MED ORDER — CETIRIZINE HCL 10 MG PO TABS: ORAL_TABLET | ORAL | 0 refills | Status: DC

## 2017-10-11 ENCOUNTER — Other Ambulatory Visit: Payer: Self-pay

## 2017-10-11 DIAGNOSIS — J301 Allergic rhinitis due to pollen: Secondary | ICD-10-CM

## 2017-11-08 ENCOUNTER — Other Ambulatory Visit: Payer: Self-pay

## 2017-11-08 DIAGNOSIS — F320 Major depressive disorder, single episode, mild: Secondary | ICD-10-CM

## 2017-11-08 MED ORDER — CITALOPRAM HYDROBROMIDE 20 MG PO TABS: 20.0000 mg | ORAL_TABLET | Freq: Every day | ORAL | 0 refills | Status: DC

## 2018-02-02 ENCOUNTER — Other Ambulatory Visit: Payer: Self-pay

## 2018-02-02 DIAGNOSIS — F320 Major depressive disorder, single episode, mild: Secondary | ICD-10-CM

## 2018-02-14 ENCOUNTER — Other Ambulatory Visit: Payer: Self-pay

## 2018-02-14 DIAGNOSIS — F320 Major depressive disorder, single episode, mild: Secondary | ICD-10-CM

## 2018-02-15 ENCOUNTER — Ambulatory Visit: Payer: BLUE CROSS/BLUE SHIELD | Admitting: Physician Assistant

## 2018-02-15 ENCOUNTER — Other Ambulatory Visit: Payer: Self-pay

## 2018-02-15 ENCOUNTER — Encounter: Payer: Self-pay | Admitting: Physician Assistant

## 2018-02-15 VITALS — BP 130/86 | HR 87 | Temp 98.2°F | Resp 16 | Wt 197.0 lb

## 2018-02-15 DIAGNOSIS — M722 Plantar fascial fibromatosis: Secondary | ICD-10-CM

## 2018-02-15 DIAGNOSIS — F329 Major depressive disorder, single episode, unspecified: Secondary | ICD-10-CM

## 2018-02-15 DIAGNOSIS — F32A Depression, unspecified: Secondary | ICD-10-CM

## 2018-02-15 DIAGNOSIS — J301 Allergic rhinitis due to pollen: Secondary | ICD-10-CM

## 2018-02-15 DIAGNOSIS — Z8249 Family history of ischemic heart disease and other diseases of the circulatory system: Secondary | ICD-10-CM | POA: Diagnosis not present

## 2018-02-15 DIAGNOSIS — J309 Allergic rhinitis, unspecified: Secondary | ICD-10-CM | POA: Diagnosis not present

## 2018-02-15 DIAGNOSIS — E785 Hyperlipidemia, unspecified: Secondary | ICD-10-CM | POA: Diagnosis not present

## 2018-02-15 DIAGNOSIS — F419 Anxiety disorder, unspecified: Secondary | ICD-10-CM

## 2018-02-15 DIAGNOSIS — F320 Major depressive disorder, single episode, mild: Secondary | ICD-10-CM | POA: Diagnosis not present

## 2018-02-15 DIAGNOSIS — F411 Generalized anxiety disorder: Secondary | ICD-10-CM | POA: Insufficient documentation

## 2018-02-15 LAB — EXTRA LAV TOP TUBE

## 2018-02-15 LAB — LIPID PANEL
CHOL/HDL RATIO: 4.6 (calc) (ref <5.0)
CHOLESTEROL: 243 mg/dL — AB (ref <200)
HDL: 53 mg/dL (ref >50)
LDL CHOLESTEROL (CALC): 176 mg/dL — AB
Non-HDL Cholesterol (Calc): 190 mg/dL (calc) — ABNORMAL HIGH (ref <130)
Triglycerides: 46 mg/dL (ref <150)

## 2018-02-15 LAB — COMPLETE METABOLIC PANEL WITH GFR
AG Ratio: 1.4 (calc) (ref 1.0–2.5)
ALBUMIN MSPROF: 3.9 g/dL (ref 3.6–5.1)
ALKALINE PHOSPHATASE (APISO): 99 U/L (ref 33–115)
ALT: 15 U/L (ref 6–29)
AST: 14 U/L (ref 10–35)
BUN: 13 mg/dL (ref 7–25)
CO2: 27 mmol/L (ref 20–32)
CREATININE: 0.61 mg/dL (ref 0.50–1.10)
Calcium: 9.4 mg/dL (ref 8.6–10.2)
Chloride: 105 mmol/L (ref 98–110)
GFR, Est African American: 124 mL/min/{1.73_m2} (ref >=60)
GFR, Est Non African American: 107 mL/min/{1.73_m2} (ref >=60)
GLOBULIN: 2.7 g/dL (ref 1.9–3.7)
GLUCOSE: 87 mg/dL (ref 65–99)
Potassium: 4.4 mmol/L (ref 3.5–5.3)
SODIUM: 140 mmol/L (ref 135–146)
Total Bilirubin: 0.3 mg/dL (ref 0.2–1.2)
Total Protein: 6.6 g/dL (ref 6.1–8.1)

## 2018-02-15 MED ORDER — CITALOPRAM HYDROBROMIDE 20 MG PO TABS: 20.0000 mg | ORAL_TABLET | Freq: Every day | ORAL | 2 refills | Status: DC

## 2018-02-15 MED ORDER — CETIRIZINE HCL 10 MG PO TABS: ORAL_TABLET | ORAL | 11 refills | Status: DC

## 2018-02-15 NOTE — Progress Notes (Signed)
Patient ID: Emily Franco MRN: [id number], DOB: 06/13/1971, 47 y.o. Date of Encounter: @DATE @  Chief Complaint:  Chief Complaint  Patient presents with  . Medication Refill    HPI: 47 y.o. year old white obese female  presents for routine follow-up and medication refill.    12/30/2015:  She states that the Celexa is working well as far as controlling her anxiety and depression symptoms. It causes no adverse effects.  I reviewed with her that when we checked labs 02/16/15 lipid panel showed LDL 150 and at that time I have recommended for her to start simvastatin 10 mg and then recheck labs 6 weeks. Today patient states that she never did start the cholesterol medicine at that time. Says that she is on no cholesterol medicine. She is not fasting today but says that she will return fasting.  Today I reviewed with her that her weight has actually decreased quite a bit. 02/16/15 was 230 pounds. Today 203 pounds. She says that she did not realize that she had lost that much weight. Says a lot of it is that she is a lot more active at her current job. Also says that she is drinking a lot of water and has pretty much cut out sodas. Also says that she does try to do better regarding healthier foods.  Even with physical exertion, is having no angina symptoms.    01/23/2017: She is on no lipid med.  She is on no BP med.  Has not had BP checked anywhere else to know how it has been running.  She is taking the Celexa. Says this is definitely working. Causes no adverse effects.  Needs med for allergies.  Says she was without insurance for a while but now has insurance so agreeable to return for f/u.  Rec return fasting to check labs this week and retrun for f/u OV in ~ 2 weeks to recheck BP since it is borderline today. She is agreeable.  Also she is asking about her weight today compared to prior weights. Says she wants to get < 200.    02/15/2018: Today I noted that her weight is down to  197. She is happy about this. Says that it is hard to get in here even though she works just down the road at [employer] that her hours are 5 AM to 3 PM so it is hard for her to get in here and especially to come fasting. However she is fasting today so that she can recheck her cholesterol. States that her Celexa just ran out this past week.  Is needing refill on that.  States that that has continued to work very well for her and his controlled her anxiety and depression and is causing no adverse effects. States that she needs a refill on her Zyrtec for her allergies.  States that she has started to have some runny nose and sneezing recently with things starting to bloom. Today she states that she thinks she has plantar fasciitis on her left foot.  States that it just recently started to bother her.  Pain is along the medial border of her arch towards her heel.  She especially notices it first thing in the morning when she first stands/walks.     Past Medical History:  Diagnosis Date  . Anxiety    history of not lately  . Depression   . Diabetes mellitus without complication (HCC)    gestational  . Hyperlipidemia   .  Hypertension   . Obesity      Home Meds: Outpatient Medications Prior to Visit  Medication Sig Dispense Refill  . cetirizine (ZYRTEC) 10 MG tablet TAKE 1 TABLET (10 MG TOTAL) BY MOUTH DAILY. 30 tablet 0  . citalopram (CELEXA) 20 MG tablet Take 1 tablet (20 mg total) by mouth daily. 30 tablet 0  . ibuprofen (ADVIL,MOTRIN) 600 MG tablet Take 1 tablet (600 mg total) by mouth every 6 (six) hours as needed. 20 tablet 0  . Acetaminophen-Caffeine (EXCEDRIN TENSION HEADACHE PO) Take by mouth.    Marland Kitchen aspirin EC 325 MG tablet Take 1 tablet (325 mg total) by mouth 2 (two) times daily. (Patient not taking: Reported on 02/16/2015) 84 tablet 0   No facility-administered medications prior to visit.     Allergies: No Known Allergies  Social History   Socioeconomic History  .  Marital status: Divorced    Spouse name: Not on file  . Number of children: Not on file  . Years of education: Not on file  . Highest education level: Not on file  Social Needs  . Financial resource strain: Not on file  . Food insecurity - worry: Not on file  . Food insecurity - inability: Not on file  . Transportation needs - medical: Not on file  . Transportation needs - non-medical: Not on file  Occupational History  . Not on file  Tobacco Use  . Smoking status: Never Smoker  . Smokeless tobacco: Never Used  Substance and Sexual Activity  . Alcohol use: Yes    Alcohol/week: 2.4 oz    Types: 4 Glasses of wine per week    Comment: occasional  . Drug use: No  . Sexual activity: Not on file  Other Topics Concern  . Not on file  Social History Narrative  . Not on file    Family History  Problem Relation Age of Onset  . Heart disease Father 69       CABG @ 39 y/o  . Heart disease Brother 43       CAD @ 62 y/o     Review of Systems:  See HPI for pertinent ROS. All other ROS negative.    Physical Exam: Blood pressure 130/86, pulse 87, temperature 98.2 F (36.8 C), temperature source Oral, resp. rate 16, weight 89.4 kg (197 lb), SpO2 98 %., Body mass index is 36.03 kg/m. General:Obese WF.  Appears in no acute distress. Neck: Supple. No thyromegaly. No lymphadenopathy. no carotid bruit. Lungs: Clear bilaterally to auscultation without wheezes, rales, or rhonchi. Breathing is unlabored. Heart: RRR with S1 S2. No murmurs, rubs, or gallops. Abdomen: Soft, non-tender, non-distended with normoactive bowel sounds. No hepatomegaly. No rebound/guarding. No obvious abdominal masses. Musculoskeletal:  Strength and tone normal for age.  Left foot: Has tenderness with palpation along the medial edge of the arch especially towards the heel. Extremities/Skin: Warm and dry.  No edema. Neuro: Alert and oriented X 3. Moves all extremities spontaneously. Gait is normal. CNII-XII grossly in  tact. Psych:  Responds to questions appropriately with a normal affect.     ASSESSMENT AND PLAN:  47 y.o. year old female with   1. Anxiety & Depression 02/15/2018:Stable/controlled. Continue current medication with Celexa 20 mg daily. - citalopram (CELEXA) 20 MG tablet; Take 1 tablet (20 mg total) by mouth daily.  Dispense: 90 tablet; Refill: 3  2. Hyperlipidemia 02/15/2018:She is not on lipid medication currently. She is  Fasting.   - Lipid panel - Hepatic function  panel  3. Obesity . 02/2015 was 230 pounds   12/2015---203.  01/2017---211 02/15/2018:---197---Congrats !!   4. Family history of coronary artery disease She has a very significant family history of Premature CAD.  Needs aggressive risk factor reduction.  02/15/2018:  She has lost weight at today's visit.  Also her blood pressure is stable.  Will recheck lipid panel at this time to manage risk factors and try to prevent CAD.   5. Acute seasonal allergic rhinitis due to pollen 02/15/2018:- cetirizine (ZYRTEC) 10 MG tablet; TAKE 1 TABLET (10 MG TOTAL) BY MOUTH DAILY.  Dispense: 30 tablet; Refill: 2  7. Plantar fasciitis 02/15/2018: I gave and reviewed handout.  This discusses treatments including proper arch support, night splint if needed.  Also reviewed specific stretches for her to do.   Signed, [address] [place], [place], [facility] 02/15/2018 9:21 AM

## 2018-02-22 ENCOUNTER — Other Ambulatory Visit: Payer: Self-pay

## 2018-02-22 DIAGNOSIS — E785 Hyperlipidemia, unspecified: Secondary | ICD-10-CM

## 2018-02-22 MED ORDER — ATORVASTATIN CALCIUM 20 MG PO TABS: 20.0000 mg | ORAL_TABLET | Freq: Every day | ORAL | 1 refills | Status: DC

## 2018-03-09 ENCOUNTER — Other Ambulatory Visit: Payer: Self-pay

## 2018-03-09 MED ORDER — ATORVASTATIN CALCIUM 20 MG PO TABS: 20.0000 mg | ORAL_TABLET | Freq: Every day | ORAL | 1 refills | Status: DC

## 2018-08-02 ENCOUNTER — Other Ambulatory Visit: Payer: Self-pay | Admitting: Physician Assistant

## 2018-10-15 ENCOUNTER — Other Ambulatory Visit: Payer: Self-pay | Admitting: Physician Assistant

## 2018-10-28 ENCOUNTER — Other Ambulatory Visit: Payer: Self-pay | Admitting: Family Medicine

## 2018-10-29 MED ORDER — ATORVASTATIN CALCIUM 20 MG PO TABS: 20.0000 mg | ORAL_TABLET | Freq: Every day | ORAL | 1 refills | Status: DC

## 2018-10-29 NOTE — Addendum Note (Signed)
Addended by: Legrand RamsWILLIS, Maki Sweetser B on: 10/29/2018 10:07 AM   Modules accepted: Orders

## 2018-11-13 ENCOUNTER — Other Ambulatory Visit: Payer: Self-pay | Admitting: *Deleted

## 2018-11-13 DIAGNOSIS — F320 Major depressive disorder, single episode, mild: Secondary | ICD-10-CM

## 2018-11-13 MED ORDER — CITALOPRAM HYDROBROMIDE 20 MG PO TABS: 20.0000 mg | ORAL_TABLET | Freq: Every day | ORAL | 2 refills | Status: DC

## 2019-05-10 ENCOUNTER — Other Ambulatory Visit: Payer: Self-pay | Admitting: Family Medicine

## 2019-05-18 ENCOUNTER — Other Ambulatory Visit: Payer: Self-pay

## 2019-05-18 ENCOUNTER — Encounter (HOSPITAL_BASED_OUTPATIENT_CLINIC_OR_DEPARTMENT_OTHER): Payer: Self-pay | Admitting: *Deleted

## 2019-05-18 ENCOUNTER — Emergency Department (HOSPITAL_BASED_OUTPATIENT_CLINIC_OR_DEPARTMENT_OTHER)
Admission: EM | Admit: 2019-05-18 | Discharge: 2019-05-18 | Disposition: A | Discharge status: Home / Self Care | Payer: BLUE CROSS/BLUE SHIELD | Attending: Emergency Medicine | Admitting: Emergency Medicine

## 2019-05-18 DIAGNOSIS — I10 Essential (primary) hypertension: Secondary | ICD-10-CM | POA: Diagnosis not present

## 2019-05-18 DIAGNOSIS — R300 Dysuria: Secondary | ICD-10-CM | POA: Diagnosis present

## 2019-05-18 DIAGNOSIS — Z79899 Other long term (current) drug therapy: Secondary | ICD-10-CM | POA: Insufficient documentation

## 2019-05-18 DIAGNOSIS — N3090 Cystitis, unspecified without hematuria: Secondary | ICD-10-CM | POA: Diagnosis not present

## 2019-05-18 LAB — COMPREHENSIVE METABOLIC PANEL
ALT: 26 U/L (ref 0–44)
AST: 19 U/L (ref 15–41)
Albumin: 3.8 g/dL (ref 3.5–5.0)
Alkaline Phosphatase: 96 U/L (ref 38–126)
Anion gap: 6 (ref 5–15)
BUN: 11 mg/dL (ref 6–20)
CO2: 24 mmol/L (ref 22–32)
Calcium: 8.8 mg/dL — ABNORMAL LOW (ref 8.9–10.3)
Chloride: 107 mmol/L (ref 98–111)
Creatinine, Ser: 0.49 mg/dL (ref 0.44–1.00)
GFR calc Af Amer: >60 mL/min (ref >60)
GFR calc non Af Amer: >60 mL/min (ref >60)
Glucose, Bld: 89 mg/dL (ref 70–99)
Potassium: 3.8 mmol/L (ref 3.5–5.1)
Sodium: 137 mmol/L (ref 135–145)
Total Bilirubin: 0.6 mg/dL (ref 0.3–1.2)
Total Protein: 6.8 g/dL (ref 6.5–8.1)

## 2019-05-18 LAB — CBC WITH DIFFERENTIAL/PLATELET
Abs Immature Granulocytes: 0 10*3/uL (ref 0.00–0.07)
Basophils Absolute: 0 10*3/uL (ref 0.0–0.1)
Basophils Relative: 1 %
Eosinophils Absolute: 0.2 10*3/uL (ref 0.0–0.5)
Eosinophils Relative: 4 %
HCT: 41 % (ref 36.0–46.0)
Hemoglobin: 13.6 g/dL (ref 12.0–15.0)
Immature Granulocytes: 0 %
Lymphocytes Relative: 27 %
Lymphs Abs: 1.2 10*3/uL (ref 0.7–4.0)
MCH: 29.8 pg (ref 26.0–34.0)
MCHC: 33.2 g/dL (ref 30.0–36.0)
MCV: 89.7 fL (ref 80.0–100.0)
Monocytes Absolute: 0.4 10*3/uL (ref 0.1–1.0)
Monocytes Relative: 9 %
Neutro Abs: 2.6 10*3/uL (ref 1.7–7.7)
Neutrophils Relative %: 59 %
Platelets: 310 10*3/uL (ref 150–400)
RBC: 4.57 MIL/uL (ref 3.87–5.11)
RDW: 12.8 % (ref 11.5–15.5)
WBC: 4.4 10*3/uL (ref 4.0–10.5)
nRBC: 0 % (ref 0.0–0.2)

## 2019-05-18 LAB — URINALYSIS, ROUTINE W REFLEX MICROSCOPIC
Bilirubin Urine: NEGATIVE
Glucose, UA: NEGATIVE mg/dL
Ketones, ur: NEGATIVE mg/dL
Nitrite: NEGATIVE
Protein, ur: NEGATIVE mg/dL
Specific Gravity, Urine: 1.015 (ref 1.005–1.030)
pH: 8.5 — ABNORMAL HIGH (ref 5.0–8.0)

## 2019-05-18 LAB — LIPASE, BLOOD: Lipase: 30 U/L (ref 11–51)

## 2019-05-18 LAB — URINALYSIS, MICROSCOPIC (REFLEX)
RBC / HPF: >50 RBC/hpf (ref 0–5)
WBC, UA: >50 WBC/hpf (ref 0–5)

## 2019-05-18 MED ORDER — CEPHALEXIN 500 MG PO CAPS: 500.0000 mg | ORAL_CAPSULE | Freq: Two times a day (BID) | ORAL | 0 refills | Status: AC

## 2019-05-18 MED ORDER — ACETAMINOPHEN 325 MG PO TABS
650.0000 mg | ORAL_TABLET | Freq: Once | ORAL | Status: AC
  Administered 2019-05-18: 650 mg via ORAL
  Filled 2019-05-18: qty 2

## 2019-05-18 NOTE — ED Notes (Signed)
ED Provider at bedside. 

## 2019-05-18 NOTE — Discharge Instructions (Addendum)

## 2019-05-18 NOTE — ED Triage Notes (Addendum)
Dysuria, frequency, urgency x 2 days; denies vaginal discharge

## 2019-05-18 NOTE — ED Provider Notes (Signed)
[facility] EMERGENCY DEPARTMENT Provider Note   CSN: [id number] Arrival date & time: 05/18/19  1404    History   Chief Complaint Chief Complaint  Patient presents with  . Dysuria    HPI Emily Franco is a 48 y.o. female with medical history of anxiety, depression, hypertension, hyperlipidemia presented to emergency department today with chief complaint of worsening dysuria x 2 days. Pt reports burning with urination and urinary frequency. She thinks symptoms might have been going on for up to 1 week, however is not sure. She reports generalized weakness and overall not feeling well. She has constant generalized abdominal pain that she describes as feeling sore over last 2 days. She also had an episode of back pain yesterday while driving home that felt like a muscle spasm. She rates pain 1/10 in severity. She has not taken anything for pain prior to arrival. Last LMP x7 yrs ago. Pt has been checking her temperature daily at work and denies any fever. Also denies chills, nausea, vomiting, hematuria, diarrhea, vaginal discharge, pelvic pain, history of kidney stones.   Past Medical History:  Diagnosis Date  . Anxiety    history of not lately  . Depression   . Diabetes mellitus without complication (HCC)    gestational  . Hyperlipidemia   . Hypertension   . Obesity     Patient Active Problem List   Diagnosis Date Noted  . Anxiety and depression 02/15/2018  . Allergic rhinitis 02/15/2018  . HTN (hypertension) 12/30/2015  . Family history of coronary artery disease 02/16/2015  . Low back pain 02/16/2015  . Depression   . Hyperlipidemia   . Obesity     Past Surgical History:  Procedure Laterality Date  . ORIF TOE FRACTURE Right 11/17/2014   Procedure: OPEN REDUCTION INTERNAL FIXATION (ORIF) RIGHT 5TH METATARSAL FRACTURE;  Surgeon: Cheral AlmasNaiping Michael Xu, MD;  Location: [facility] OR;  Service: Orthopedics;  Laterality: Right;  . WISDOM TOOTH EXTRACTION     "laughing gas"      OB History   No obstetric history on file.      Home Medications    Prior to Admission medications   Medication Sig Start Date End Date Taking? Authorizing Provider  atorvastatin (LIPITOR) 20 MG tablet Take 1 tablet (20 mg total) by mouth daily. 03/09/18  Yes Dorena Bodoixon, Mary B, PA-C  citalopram (CELEXA) 20 MG tablet Take 1 tablet (20 mg total) by mouth daily. 11/13/18  Yes [place], Velna HatchetKawanta F, MD  Acetaminophen-Caffeine [facility](EXCEDRIN TENSION HEADACHE PO) Take by mouth.    [provider]  atorvastatin (LIPITOR) 20 MG tablet Take 1 tablet (20 mg total) by mouth daily. 10/29/18   [place], Velna HatchetKawanta F, MD  cephALEXin (KEFLEX) 500 MG capsule Take 1 capsule (500 mg total) by mouth 2 (two) times daily for 5 days. 05/18/19 05/23/19  Atticus Lemberger E, PA-C  cetirizine (ZYRTEC) 10 MG tablet TAKE 1 TABLET (10 MG TOTAL) BY MOUTH DAILY. 02/15/18   Dorena Bodoixon, Mary B, PA-C  ibuprofen (ADVIL,MOTRIN) 600 MG tablet Take 1 tablet (600 mg total) by mouth every 6 (six) hours as needed. 07/29/17   Jacalyn LefevreHaviland, Julie, MD    Family History Family History  Problem Relation Age of Onset  . Heart disease Father 8158       CABG @ 50 y/o  . Heart disease Brother 7643       CAD @ 50 y/o    Social History Social History   Tobacco Use  . Smoking status: Never Smoker  .  Smokeless tobacco: Never Used  Substance Use Topics  . Alcohol use: Yes    Alcohol/week: 4.0 standard drinks    Types: 4 Glasses of wine per week    Comment: occasional  . Drug use: No     Allergies   Patient has no known allergies.   Review of Systems Review of Systems  Constitutional: Negative for chills and fever.  HENT: Negative for congestion, ear discharge, ear pain, sinus pressure, sinus pain and sore throat.   Eyes: Negative for pain and redness.  Respiratory: Negative for cough and shortness of breath.   Cardiovascular: Negative for chest pain.  Gastrointestinal: Positive for abdominal pain. Negative for abdominal distention,  constipation, diarrhea, nausea and vomiting.  Genitourinary: Positive for dysuria and frequency. Negative for hematuria, pelvic pain, urgency, vaginal discharge and vaginal pain.  Musculoskeletal: Positive for back pain. Negative for neck pain.  Skin: Negative for wound.  Neurological: Positive for weakness. Negative for numbness and headaches.     Physical Exam Updated Vital Signs BP 134/89 (BP Location: Right Arm)   Pulse 65   Temp 98.5 F (36.9 C) (Oral)   Resp 16   Ht 5\' 2"  (1.575 m)   Wt 86.2 kg   SpO2 99%   BMI 34.75 kg/m   Physical Exam Vitals signs and nursing note reviewed.  Constitutional:      General: She is not in acute distress.    Appearance: She is not ill-appearing.  HENT:     Head: Normocephalic and atraumatic.     Right Ear: Tympanic membrane and external ear normal.     Left Ear: Tympanic membrane and external ear normal.     Nose: Nose normal.     Mouth/Throat:     Mouth: Mucous membranes are moist.     Pharynx: Oropharynx is clear.  Eyes:     General: No scleral icterus.       Right eye: No discharge.        Left eye: No discharge.     Extraocular Movements: Extraocular movements intact.     Conjunctiva/sclera: Conjunctivae normal.     Pupils: Pupils are equal, round, and reactive to light.  Neck:     Musculoskeletal: Normal range of motion.     Vascular: No JVD.  Cardiovascular:     Rate and Rhythm: Normal rate and regular rhythm.     Pulses: Normal pulses.          Radial pulses are 2+ on the right side and 2+ on the left side.     Heart sounds: Normal heart sounds.  Pulmonary:     Comments: Lungs clear to auscultation in all fields. Symmetric chest rise. No wheezing, rales, or rhonchi. Abdominal:     Tenderness: There is no right CVA tenderness or left CVA tenderness.     Comments: Abdomen is soft, non-distended, and non-tender in all quadrants. No rigidity, no guarding. No peritoneal signs.  Musculoskeletal: Normal range of motion.   Skin:    General: Skin is warm and dry.     Capillary Refill: Capillary refill takes less than 2 seconds.  Neurological:     Mental Status: She is oriented to person, place, and time.     GCS: GCS eye subscore is 4. GCS verbal subscore is 5. GCS motor subscore is 6.     Comments: Fluent speech, no facial droop.  Psychiatric:        Behavior: Behavior normal.      ED Treatments /  Results  Labs (all labs ordered are listed, but only abnormal results are displayed) Labs Reviewed  URINALYSIS, ROUTINE W REFLEX MICROSCOPIC - Abnormal; Notable for the following components:      Result Value   Color, Urine STRAW (*)    APPearance CLOUDY (*)    pH 8.5 (*)    Hgb urine dipstick LARGE (*)    Leukocytes,Ua MODERATE (*)    All other components within normal limits  COMPREHENSIVE METABOLIC PANEL - Abnormal; Notable for the following components:   Calcium 8.8 (*)    All other components within normal limits  URINALYSIS, MICROSCOPIC (REFLEX) - Abnormal; Notable for the following components:   Bacteria, UA MANY (*)    All other components within normal limits  URINE CULTURE  CBC WITH DIFFERENTIAL/PLATELET  LIPASE, BLOOD    EKG None  Radiology No results found.  Procedures Procedures (including critical care time)  Medications Ordered in ED Medications  acetaminophen (TYLENOL) tablet 650 mg (650 mg Oral Given 05/18/19 1538)     Initial Impression / Assessment and Plan / ED Course  I have reviewed the triage vital signs and the nursing notes.  Pertinent labs & imaging results that were available during my care of the patient were reviewed by me and considered in my medical decision making (see chart for details).  Pt has been diagnosed with a UTI. Pt is afebrile, no CVA tenderness, normotensive, and denies N/V. UA with signs of infection including moderate leukocytes, WBC over 50 and many bacteria. UC sent. CBC unremarkable, no leukocytosis. CMP and Lipase unremarkable. Pt to be  dc home with antibiotics and instructions to follow up with PCP if symptoms persist.   This note was prepared using Dragon voice recognition software and may include unintentional dictation errors due to the inherent limitations of voice recognition software.    Final Clinical Impressions(s) / ED Diagnoses   Final diagnoses:  Cystitis    ED Discharge Orders         Ordered    cephALEXin (KEFLEX) 500 MG capsule  2 times daily     05/18/19 1533           Jarion Hawthorne, [place], PA-C 05/18/19 1612    Dorie Rank, MD 05/19/19 [phone]

## 2019-05-20 LAB — URINE CULTURE: Culture: >=100000 — AB

## 2019-08-22 ENCOUNTER — Other Ambulatory Visit: Payer: Self-pay | Admitting: Family Medicine

## 2019-08-22 DIAGNOSIS — F320 Major depressive disorder, single episode, mild: Secondary | ICD-10-CM

## 2019-09-30 ENCOUNTER — Other Ambulatory Visit: Payer: Self-pay | Admitting: Family Medicine

## 2019-09-30 DIAGNOSIS — F320 Major depressive disorder, single episode, mild: Secondary | ICD-10-CM

## 2019-10-28 ENCOUNTER — Other Ambulatory Visit: Payer: Self-pay | Admitting: Family Medicine

## 2019-10-28 DIAGNOSIS — F320 Major depressive disorder, single episode, mild: Secondary | ICD-10-CM

## 2019-11-04 ENCOUNTER — Telehealth: Payer: Self-pay | Admitting: Family Medicine

## 2019-11-04 DIAGNOSIS — F320 Major depressive disorder, single episode, mild: Secondary | ICD-10-CM

## 2019-11-04 NOTE — Telephone Encounter (Signed)
Patient made appt with dr pickard for med refills for next week, however is going to be out of her celexa before then and needs it  [facility]  [phone]

## 2019-11-05 MED ORDER — CITALOPRAM HYDROBROMIDE 20 MG PO TABS: 20.0000 mg | ORAL_TABLET | Freq: Every day | ORAL | 0 refills | Status: DC

## 2019-11-05 NOTE — Telephone Encounter (Signed)
Medication called/sent to requested pharmacy  

## 2019-11-13 ENCOUNTER — Other Ambulatory Visit: Payer: Self-pay

## 2019-11-14 ENCOUNTER — Ambulatory Visit: Payer: BLUE CROSS/BLUE SHIELD | Admitting: Family Medicine

## 2019-11-15 ENCOUNTER — Other Ambulatory Visit: Payer: Self-pay

## 2019-11-15 ENCOUNTER — Ambulatory Visit (HOSPITAL_COMMUNITY)
Admission: EM | Admit: 2019-11-15 | Discharge: 2019-11-15 | Disposition: A | Discharge status: Home / Self Care | Payer: BLUE CROSS/BLUE SHIELD | Attending: Emergency Medicine | Admitting: Emergency Medicine

## 2019-11-15 ENCOUNTER — Encounter (HOSPITAL_COMMUNITY): Payer: Self-pay

## 2019-11-15 DIAGNOSIS — Z20828 Contact with and (suspected) exposure to other viral communicable diseases: Secondary | ICD-10-CM | POA: Diagnosis present

## 2019-11-15 DIAGNOSIS — Z20822 Contact with and (suspected) exposure to covid-19: Secondary | ICD-10-CM

## 2019-11-15 DIAGNOSIS — R0602 Shortness of breath: Secondary | ICD-10-CM

## 2019-11-15 DIAGNOSIS — R300 Dysuria: Secondary | ICD-10-CM | POA: Diagnosis not present

## 2019-11-15 LAB — POCT URINALYSIS DIP (DEVICE)
Bilirubin Urine: NEGATIVE
Glucose, UA: NEGATIVE mg/dL
Ketones, ur: NEGATIVE mg/dL
Nitrite: NEGATIVE
Protein, ur: 30 mg/dL — AB
Specific Gravity, Urine: 1.025 (ref 1.005–1.030)
Urobilinogen, UA: 0.2 mg/dL (ref 0.0–1.0)
pH: 6 (ref 5.0–8.0)

## 2019-11-15 LAB — POC SARS CORONAVIRUS 2 AG: SARS Coronavirus 2 Ag: NEGATIVE

## 2019-11-15 LAB — POC SARS CORONAVIRUS 2 AG -  ED: SARS Coronavirus 2 Ag: NEGATIVE

## 2019-11-15 MED ORDER — NITROFURANTOIN MONOHYD MACRO 100 MG PO CAPS: 100.0000 mg | ORAL_CAPSULE | Freq: Two times a day (BID) | ORAL | 0 refills | Status: DC

## 2019-11-15 MED ORDER — ALBUTEROL SULFATE HFA 108 (90 BASE) MCG/ACT IN AERS: 2.0000 | INHALATION_SPRAY | Freq: Four times a day (QID) | RESPIRATORY_TRACT | 0 refills | Status: DC | PRN

## 2019-11-15 NOTE — Discharge Instructions (Addendum)
Your rapid COVID-19 test is negative  Your UA show Hgb, protein and WBC  COVID testing ordered.  It will take between 2-7 days for test results.  Someone will contact you regarding abnormal results.    In the meantime: You should remain isolated in your home for 10 days from symptom onset AND greater than 72 hours after symptoms resolution (absence of fever without the use of fever-reducing medication and improvement in respiratory symptoms), whichever is longer Get plenty of rest and push fluids Pro-Air for shortness of breath Use medications daily for symptom relief Use OTC medications like ibuprofen or tylenol as needed fever or pain Call or go to the ED if you have any new or worsening symptoms such as fever, worsening cough, shortness of breath, chest tightness, chest pain, turning blue, changes in mental status, etc..Marland Kitchen

## 2019-11-15 NOTE — ED Triage Notes (Signed)
Pt presents to UC w/ c/o headaches, sob x2 weeks and covid positive exposure. Pt states 2 people in her house are covid positive.

## 2019-11-15 NOTE — ED Provider Notes (Signed)
[facility]    CSN: [id number] Arrival date & time: 11/15/19  1641      History   Chief Complaint Chief Complaint  Patient presents with  . headache, sob    HPI Emily Franco is a 48 y.o. female.   Emily Franco 38 y old with past med hx of depression, HTN, low back pain and allergic rhinitis presents to the urgent care with headache and shortness of breath x 2-3 days and dysuria x 2weeks.  Denies sick exposure to COVID, flu or strep.  Denies recent travel.  Denies aggravating or alleviating symptoms.  Denies previous COVID infection.   Denies fever, chills, fatigue, nasal congestion, rhinorrhea, sore throat, cough, SOB, wheezing, chest pain, nausea, vomiting, changes in bowel or bladder habits.    The history is provided by the patient. No language interpreter was used.    Past Medical History:  Diagnosis Date  . Anxiety    history of not lately  . Depression   . Diabetes mellitus without complication (HCC)    gestational  . Hyperlipidemia   . Hypertension   . Obesity     Patient Active Problem List   Diagnosis Date Noted  . Anxiety and depression 02/15/2018  . Allergic rhinitis 02/15/2018  . HTN (hypertension) 12/30/2015  . Family history of coronary artery disease 02/16/2015  . Low back pain 02/16/2015  . Depression   . Hyperlipidemia   . Obesity     Past Surgical History:  Procedure Laterality Date  . ORIF TOE FRACTURE Right 11/17/2014   Procedure: OPEN REDUCTION INTERNAL FIXATION (ORIF) RIGHT 5TH METATARSAL FRACTURE;  Surgeon: Cheral Almas, MD;  Location: [facility] OR;  Service: Orthopedics;  Laterality: Right;  . WISDOM TOOTH EXTRACTION     "laughing gas"    OB History   No obstetric history on file.      Home Medications    Prior to Admission medications   Medication Sig Start Date End Date Taking? Authorizing Provider  Acetaminophen-Caffeine (EXCEDRIN TENSION HEADACHE PO) Take by mouth.    [provider]   atorvastatin (LIPITOR) 20 MG tablet Take 1 tablet (20 mg total) by mouth daily. 03/09/18   Dorena Bodo, PA-C  atorvastatin (LIPITOR) 20 MG tablet Take 1 tablet (20 mg total) by mouth daily. 10/29/18   [place], Velna Hatchet, MD  cetirizine (ZYRTEC) 10 MG tablet TAKE 1 TABLET (10 MG TOTAL) BY MOUTH DAILY. 02/15/18   Dorena Bodo, PA-C  citalopram (CELEXA) 20 MG tablet Take 1 tablet (20 mg total) by mouth daily. 11/05/19   Donita Brooks, MD  ibuprofen (ADVIL,MOTRIN) 600 MG tablet Take 1 tablet (600 mg total) by mouth every 6 (six) hours as needed. 07/29/17   Jacalyn Lefevre, MD    Family History Family History  Problem Relation Age of Onset  . Heart disease Father 22       CABG @ 71 y/o  . Heart disease Brother 61       CAD @ 11 y/o    Social History Social History   Tobacco Use  . Smoking status: Never Smoker  . Smokeless tobacco: Never Used  Substance Use Topics  . Alcohol use: Yes    Alcohol/week: 4.0 standard drinks    Types: 4 Glasses of wine per week    Comment: occasional  . Drug use: No     Allergies   Patient has no known allergies.   Review of Systems Review of Systems  Constitutional: Negative.  HENT: Negative.   Respiratory: Positive for shortness of breath. Negative for cough and chest tightness.   Cardiovascular: Negative.   Genitourinary: Positive for dysuria and urgency. Negative for flank pain, vaginal discharge and vaginal pain.  Neurological: Positive for headaches.  ROS: All other are negative   Physical Exam Triage Vital Signs ED Triage Vitals  Enc Vitals Group     BP 11/15/19 1747 113/87     Pulse Rate 11/15/19 1747 65     Resp 11/15/19 1747 16     Temp 11/15/19 1747 98.3 F (36.8 C)     Temp Source 11/15/19 1747 Oral     SpO2 11/15/19 1747 99 %     Weight --      Height --      Head Circumference --      Peak Flow --      Pain Score 11/15/19 1751 0     Pain Loc --      Pain Edu? --      Excl. in [place]? --    No data found.  Updated  Vital Signs BP 113/87 (BP Location: Left Arm)   Pulse 65   Temp 98.3 F (36.8 C) (Oral)   Resp 16   SpO2 99%   Visual Acuity Right Eye Distance:   Left Eye Distance:   Bilateral Distance:    Right Eye Near:   Left Eye Near:    Bilateral Near:     Physical Exam Vitals signs and nursing note reviewed.  Constitutional:      General: She is not in acute distress.    Appearance: Normal appearance. She is normal weight. She is not ill-appearing or toxic-appearing.  HENT:     Head: Normocephalic.     Right Ear: Tympanic membrane, ear canal and external ear normal. There is no impacted cerumen.     Left Ear: Tympanic membrane, ear canal and external ear normal. There is no impacted cerumen.     Nose: Nose normal. No congestion.     Mouth/Throat:     Mouth: Mucous membranes are moist.     Pharynx: No oropharyngeal exudate or posterior oropharyngeal erythema.  Cardiovascular:     Rate and Rhythm: Normal rate and regular rhythm.     Pulses: Normal pulses.     Heart sounds: Normal heart sounds. No murmur.  Pulmonary:     Effort: Pulmonary effort is normal. No respiratory distress.     Breath sounds: Normal breath sounds. No wheezing.  Chest:     Chest wall: No tenderness.  Genitourinary:    General: Normal vulva.     Vagina: No vaginal discharge.  Neurological:     Mental Status: She is alert and oriented to person, place, and time.      UC Treatments / Results  Labs (all labs ordered are listed, but only abnormal results are displayed) Labs Reviewed  POCT URINALYSIS DIP (DEVICE) - Abnormal; Notable for the following components:      Result Value   Hgb urine dipstick TRACE (*)    Protein, ur 30 (*)    Leukocytes,Ua LARGE (*)    All other components within normal limits  URINE CULTURE  URINALYSIS, ROUTINE W REFLEX MICROSCOPIC  POC SARS CORONAVIRUS 2 AG -  ED  POC SARS CORONAVIRUS 2 AG    EKG   Radiology No results found.  Procedures Procedures (including  critical care time)  Medications Ordered in UC Medications - No data to display  Initial Impression /  Assessment and Plan / UC Course  I have reviewed the triage vital signs and the nursing notes.  Pertinent labs & imaging results that were available during my care of the patient were reviewed by me and considered in my medical decision making (see chart for details).    Patient stable for discharge.  UA show hemoglobin, protein and white blood cell.  We will treat for possible UTI.  Urine was will be sent for culture.  Point-of-care COVID-19 test was negative.  [facility] Covid test will be sent and patient will be called if results are positive.  Patient is advised to follow-up with PCP or if symptoms get worse to return. Final Clinical Impressions(s) / UC Diagnoses   Final diagnoses:  Dysuria  Suspected COVID-19 virus infection  SOB (shortness of breath)     Discharge Instructions     Your rapid COVID-19 test is negative  Your UA show Hgb, protein and WBC  COVID testing ordered.  It will take between 2-7 days for test results.  Someone will contact you regarding abnormal results.    In the meantime: You should remain isolated in your home for 10 days from symptom onset AND greater than 72 hours after symptoms resolution (absence of fever without the use of fever-reducing medication and improvement in respiratory symptoms), whichever is longer Get plenty of rest and push fluids Pro-Air for shortness of breath Use medications daily for symptom relief Use OTC medications like ibuprofen or tylenol as needed fever or pain Call or go to the ED if you have any new or worsening symptoms such as fever, worsening cough, shortness of breath, chest tightness, chest pain, turning blue, changes in mental status, etc...    ED Prescriptions    None     PDMP not reviewed this encounter.   Durward Parcelvegno, Sem Mccaughey S, FNP 11/15/19 [phone]

## 2019-11-17 LAB — URINE CULTURE

## 2019-11-17 LAB — NOVEL CORONAVIRUS, NAA (HOSP ORDER, SEND-OUT TO REF LAB; TAT 18-24 HRS): SARS-CoV-2, NAA: NOT DETECTED

## 2019-11-27 ENCOUNTER — Other Ambulatory Visit: Payer: Self-pay | Admitting: Family Medicine

## 2019-11-27 DIAGNOSIS — F320 Major depressive disorder, single episode, mild: Secondary | ICD-10-CM

## 2020-02-09 ENCOUNTER — Emergency Department (HOSPITAL_BASED_OUTPATIENT_CLINIC_OR_DEPARTMENT_OTHER)
Admission: EM | Admit: 2020-02-09 | Discharge: 2020-02-09 | Disposition: A | Discharge status: Home / Self Care | Payer: 59 | Attending: Emergency Medicine | Admitting: Emergency Medicine

## 2020-02-09 ENCOUNTER — Other Ambulatory Visit: Payer: Self-pay

## 2020-02-09 ENCOUNTER — Encounter (HOSPITAL_BASED_OUTPATIENT_CLINIC_OR_DEPARTMENT_OTHER): Payer: Self-pay

## 2020-02-09 ENCOUNTER — Emergency Department (HOSPITAL_BASED_OUTPATIENT_CLINIC_OR_DEPARTMENT_OTHER): Payer: 59

## 2020-02-09 DIAGNOSIS — Y929 Unspecified place or not applicable: Secondary | ICD-10-CM | POA: Insufficient documentation

## 2020-02-09 DIAGNOSIS — Y9301 Activity, walking, marching and hiking: Secondary | ICD-10-CM | POA: Diagnosis not present

## 2020-02-09 DIAGNOSIS — Y999 Unspecified external cause status: Secondary | ICD-10-CM | POA: Diagnosis not present

## 2020-02-09 DIAGNOSIS — W19XXXA Unspecified fall, initial encounter: Secondary | ICD-10-CM

## 2020-02-09 DIAGNOSIS — Z79899 Other long term (current) drug therapy: Secondary | ICD-10-CM | POA: Insufficient documentation

## 2020-02-09 DIAGNOSIS — R0789 Other chest pain: Secondary | ICD-10-CM | POA: Insufficient documentation

## 2020-02-09 DIAGNOSIS — S0083XA Contusion of other part of head, initial encounter: Secondary | ICD-10-CM | POA: Insufficient documentation

## 2020-02-09 DIAGNOSIS — S0993XA Unspecified injury of face, initial encounter: Secondary | ICD-10-CM

## 2020-02-09 DIAGNOSIS — W010XXA Fall on same level from slipping, tripping and stumbling without subsequent striking against object, initial encounter: Secondary | ICD-10-CM | POA: Diagnosis not present

## 2020-02-09 DIAGNOSIS — I10 Essential (primary) hypertension: Secondary | ICD-10-CM | POA: Diagnosis not present

## 2020-02-09 DIAGNOSIS — E119 Type 2 diabetes mellitus without complications: Secondary | ICD-10-CM | POA: Diagnosis not present

## 2020-02-09 DIAGNOSIS — E785 Hyperlipidemia, unspecified: Secondary | ICD-10-CM | POA: Diagnosis not present

## 2020-02-09 DIAGNOSIS — R519 Headache, unspecified: Secondary | ICD-10-CM | POA: Diagnosis not present

## 2020-02-09 DIAGNOSIS — S299XXA Unspecified injury of thorax, initial encounter: Secondary | ICD-10-CM

## 2020-02-09 DIAGNOSIS — S0990XA Unspecified injury of head, initial encounter: Secondary | ICD-10-CM

## 2020-02-09 HISTORY — DX: Polyneuropathy, unspecified: G62.9

## 2020-02-09 MED ORDER — LIDOCAINE 5 % EX PTCH: 1.0000 | MEDICATED_PATCH | CUTANEOUS | 0 refills | Status: DC

## 2020-02-09 MED ORDER — DICLOFENAC SODIUM 1 % EX GEL: 4.0000 g | Freq: Four times a day (QID) | CUTANEOUS | 2 refills | Status: DC

## 2020-02-09 MED ORDER — HYDROCODONE-ACETAMINOPHEN 5-325 MG PO TABS: 1.0000 | ORAL_TABLET | Freq: Four times a day (QID) | ORAL | 0 refills | Status: DC | PRN

## 2020-02-09 NOTE — ED Provider Notes (Signed)
[facility] EMERGENCY DEPARTMENT Provider Note   CSN: [id number] Arrival date & time: 02/09/20  1344     History Chief Complaint  Patient presents with  . Fall    Emily Franco is a 49 y.o. female.  HPI      Emily Franco is a 49 y.o. female, with a history of HTN, hyperlipidemia, presenting to the ED for evaluation of injuries due to a fall that occurred around 8:30 PM last night. Patient states she was walking through a dark area her hands fall, tripped, and fell onto the concrete.  She is complaining of pain bruising below her right eye, bilateral lower ribs, upper abdomen, bilateral shoulders.  Pain is worst in the ribs, especially on the left side, moderate to severe, sharp. Denies anticoagulation.  Denies LOC, vision abnormalities, numbness, weakness, shortness of breath, hemoptysis, diarrhea, hematochezia/melena, nausea/vomiting, headache, confusion, neck/back pain, or any other complaints.     Past Medical History:  Diagnosis Date  . Anxiety    history of not lately  . Depression   . Diabetes mellitus without complication (HCC)    gestational  . Hyperlipidemia   . Hypertension   . Neuropathy   . Obesity     Patient Active Problem List   Diagnosis Date Noted  . Anxiety and depression 02/15/2018  . Allergic rhinitis 02/15/2018  . HTN (hypertension) 12/30/2015  . Family history of coronary artery disease 02/16/2015  . Low back pain 02/16/2015  . Depression   . Hyperlipidemia   . Obesity     Past Surgical History:  Procedure Laterality Date  . ORIF TOE FRACTURE Right 11/17/2014   Procedure: OPEN REDUCTION INTERNAL FIXATION (ORIF) RIGHT 5TH METATARSAL FRACTURE;  Surgeon: Cheral Almas, MD;  Location: [facility] OR;  Service: Orthopedics;  Laterality: Right;  . WISDOM TOOTH EXTRACTION     "laughing gas"     OB History   No obstetric history on file.     Family History  Problem Relation Age of Onset  . Heart disease Father 53      CABG @ 48 y/o  . Heart disease Brother 85       CAD @ 61 y/o    Social History   Tobacco Use  . Smoking status: Never Smoker  . Smokeless tobacco: Never Used  Substance Use Topics  . Alcohol use: Yes    Alcohol/week: 4.0 standard drinks    Types: 4 Glasses of wine per week    Comment: occasional  . Drug use: No    Home Medications Prior to Admission medications   Medication Sig Start Date End Date Taking? Authorizing Provider  Acetaminophen-Caffeine (EXCEDRIN TENSION HEADACHE PO) Take by mouth.    [provider]  albuterol (VENTOLIN HFA) 108 (90 Base) MCG/ACT inhaler Inhale 2 puffs into the lungs every 6 (six) hours as needed for wheezing or shortness of breath. 11/15/19   Avegno, Zachery Dakins, FNP  atorvastatin (LIPITOR) 20 MG tablet Take 1 tablet (20 mg total) by mouth daily. 03/09/18   Dorena Bodo, PA-C  atorvastatin (LIPITOR) 20 MG tablet Take 1 tablet (20 mg total) by mouth daily. 10/29/18   [place], Velna Hatchet, MD  cetirizine (ZYRTEC) 10 MG tablet TAKE 1 TABLET (10 MG TOTAL) BY MOUTH DAILY. 02/15/18   Allayne Butcher B, PA-C  citalopram (CELEXA) 20 MG tablet TAKE 1 TABLET BY MOUTH EVERY DAY 11/27/19   Donita Brooks, MD  diclofenac Sodium (VOLTAREN) 1 % GEL Apply 4 g topically  4 (four) times daily. 02/09/20   Dayna Alia C, PA-C  HYDROcodone-acetaminophen (NORCO/VICODIN) 5-325 MG tablet Take 1 tablet by mouth every 6 (six) hours as needed for severe pain. 02/09/20   Tanasia Budzinski C, PA-C  ibuprofen (ADVIL,MOTRIN) 600 MG tablet Take 1 tablet (600 mg total) by mouth every 6 (six) hours as needed. 07/29/17   Jacalyn Lefevre, MD  lidocaine (LIDODERM) 5 % Place 1 patch onto the skin daily. Remove & Discard patch within 12 hours or as directed by MD 02/09/20   Anselm Pancoast, PA-C  nitrofurantoin, macrocrystal-monohydrate, (MACROBID) 100 MG capsule Take 1 capsule (100 mg total) by mouth 2 (two) times daily. 11/15/19   AvegnoZachery Dakins, FNP    Allergies    Patient has no known  allergies.  Review of Systems   Review of Systems  Constitutional: Negative for fever.  Eyes: Negative for visual disturbance.  Respiratory: Negative for shortness of breath.   Cardiovascular: Negative for leg swelling.  Gastrointestinal: Positive for abdominal pain. Negative for diarrhea, nausea and vomiting.  Genitourinary: Negative for difficulty urinating.  Musculoskeletal: Positive for arthralgias. Negative for back pain and neck pain.       Rib pain and tenderness  Skin: Positive for color change.  Neurological: Negative for dizziness, syncope, weakness, light-headedness, numbness and headaches.  All other systems reviewed and are negative.   Physical Exam Updated Vital Signs BP (!) 152/91 (BP Location: Left Arm)   Pulse 68   Temp 98.1 F (36.7 C) (Oral)   Resp 16   Ht 5\' 2"  (1.575 m)   Wt 86.2 kg   SpO2 97%   BMI 34.75 kg/m   Physical Exam Vitals and nursing note reviewed.  Constitutional:      General: She is not in acute distress.    Appearance: She is well-developed. She is not diaphoretic.  HENT:     Head: Normocephalic.      Comments: Bruising and tenderness to the right infraorbital rim without noted deformity, swelling, or instability. No pain with EOMs.  EOMs in sync.  No noted signs of entrapment with EOMs. No other areas of tenderness or injury to the rest of the face or head.    Nose:     Comments: No noted septal hematoma.  No tenderness to the nose.  No deformity, instability, swelling, or color change.    Mouth/Throat:     Mouth: Mucous membranes are moist.     Pharynx: Oropharynx is clear.     Comments: Dentition appears to be intact and stable.  No noted area of intraoral swelling.  No trismus or noted abnormal phonation.  Full range of motion in the mandible without pain or noted difficulty.  Mouth opening to at least 3 finger widths.  Handles oral secretions without difficulty.  No noted facial swelling.  No sublingual swelling.  No swelling or  tenderness to the submental or submandibular regions.  No swelling or tenderness into the soft tissues of the neck. Eyes:     Extraocular Movements: Extraocular movements intact.     Conjunctiva/sclera: Conjunctivae normal.     Pupils: Pupils are equal, round, and reactive to light.  Cardiovascular:     Rate and Rhythm: Normal rate and regular rhythm.     Pulses: Normal pulses.          Radial pulses are 2+ on the right side and 2+ on the left side.       Posterior tibial pulses are 2+ on the right  side and 2+ on the left side.     Heart sounds: Normal heart sounds.     Comments: Tactile temperature in the extremities appropriate and equal bilaterally. Pulmonary:     Effort: Pulmonary effort is normal. No respiratory distress.     Breath sounds: Normal breath sounds.     Comments: No increased work of breathing.  Speaks in full sentences without difficulty. Chest:     Chest wall: Tenderness present. No deformity, swelling, crepitus or edema.       Comments: Tenderness to the bilateral lower ribs as well as the sternum without noted deformity, instability, color change, or swelling. Abdominal:     Palpations: Abdomen is soft.     Tenderness: There is no guarding.    Musculoskeletal:     Cervical back: Neck supple.     Right lower leg: No edema.     Left lower leg: No edema.     Comments: Patient endorses some minor pain with range of motion of the bilateral shoulders.  Range of motion intact without noted difficulty. No tenderness, deformity, instability, or swelling over the clavicles.  Patient can use her hands to touch the contralateral shoulders, bilaterally.  Normal motor function intact in all extremities. No midline spinal tenderness.   Lymphadenopathy:     Cervical: No cervical adenopathy.  Skin:    General: Skin is warm and dry.  Neurological:     Mental Status: She is alert and oriented to person, place, and time.     Comments: No noted acute cognitive  deficit. Sensation grossly intact to light touch in the extremities.   Grip strengths equal bilaterally.   Strength 5/5 in all extremities.  No gait disturbance.  Coordination intact.  Cranial nerves III-XII grossly intact.  Handles oral secretions without noted difficulty.  No noted phonation or speech deficit. No facial droop.   Psychiatric:        Mood and Affect: Mood and affect normal.        Speech: Speech normal.        Behavior: Behavior normal.     ED Results / Procedures / Treatments   Labs (all labs ordered are listed, but only abnormal results are displayed) Labs Reviewed - No data to display  EKG None  Radiology DG Chest 2 View  Result Date: 02/09/2020 CLINICAL DATA:  Fall, chest trauma, pain of the lower anterior chest EXAM: CHEST - 2 VIEW COMPARISON:  11/24/2011 FINDINGS: The heart size and mediastinal contours are within normal limits. Both lungs are clear. The visualized skeletal structures are unremarkable. IMPRESSION: No acute abnormality of the lungs. No obvious displaced rib fracture or other abnormality of the chest. Please note that plain radiographs are very insensitive for evaluation of the anterior ribs and sternum. Electronically Signed   By: Lauralyn Primes M.D.   On: 02/09/2020 14:17    Procedures Procedures (including critical care time)  Medications Ordered in ED Medications - No data to display  ED Course  I have reviewed the triage vital signs and the nursing notes.  Pertinent labs & imaging results that were available during my care of the patient were reviewed by me and considered in my medical decision making (see chart for details).    MDM Rules/Calculators/A&P                      Patient presents for evaluation following a fall that occurred last night. Patient is nontoxic appearing, afebrile, not tachycardic, not  tachypneic, not hypotensive, maintains excellent SPO2 on room air, and is in no apparent distress.  I reviewed and  interpreted the patient's radiological studies. Patient's chest x-ray without acute abnormality, however, it is acknowledged that this is not the best imaging study for evaluation of chest trauma. This was discussed with patient in quite a bit of detail and further imaging studies were recommended.  I explicitly told the patient that without further imaging studies, namely CT scans, I would not be able to evaluate her for more serious injury, i.e. multiple rib fractures, lung injury, intra-abdominal injury, etc. she acknowledged this limitation and voiced her understanding.  She declined these imaging studies. The patient was given instructions for home care as well as strict return precautions. Patient voices understanding of these instructions, accepts the plan, and is comfortable with discharge.   Findings and plan of care discussed with Gara Kroner, MD.   Vitals:   02/09/20 1401 02/09/20 1643  BP: (!) 152/91 (!) 150/97  Pulse: 68 77  Resp: 16 18  Temp: 98.1 F (36.7 C)   TempSrc: Oral   SpO2: 97% 97%  Weight: 86.2 kg   Height: 5\' 2"  (1.575 m)      Final Clinical Impression(s) / ED Diagnoses Final diagnoses:  Fall, initial encounter  Facial injury, initial encounter  Injury of head, initial encounter  Chest injury, initial encounter    Rx / DC Orders ED Discharge Orders         Ordered    HYDROcodone-acetaminophen (NORCO/VICODIN) 5-325 MG tablet  Every 6 hours PRN     02/09/20 1647    diclofenac Sodium (VOLTAREN) 1 % GEL  4 times daily     02/09/20 1647    lidocaine (LIDODERM) 5 %  Every 24 hours     02/09/20 1647           Lorayne Bender, PA-C 02/09/20 1944    Margette Fast, MD 02/10/20 1122

## 2020-02-09 NOTE — ED Triage Notes (Signed)
Pt reportedly slipped and fell into the door and then fell to the ground yesterday. Pt c/o bruising under R eye, pain to bilateral shoulders, pain to bilateral lower chest wall.

## 2020-02-09 NOTE — Discharge Instructions (Addendum)
First and foremost, it is important to communicate that we are unable to tell you if you have more serious chest or abdominal injury at this time without further investigation and imaging studies. Should any symptoms worsen, please call 911 or return to the emergency department immediately.   Rib injuries  You have sustained a rib injury.  There were no acute abnormalities noted on the x-ray, including no sign of fractures, although there can be fractures that do not show up on the x-ray, called occult fractures.  Please adhere to the following instructions: Incentive spirometer: This device is used to ensure proper expansion of the lungs and to help prevent secondary issues, such as pneumonia.  Think of this as physical therapy for your lungs while you are injured.  Perform lung expansion exercises every 1-2 hours while awake.  Have an initial goal of 1000 mL and then work to increase this value.  Antiinflammatory medications: Take 600 mg of ibuprofen every 6 hours or 440 mg (over the counter dose) to 500 mg (prescription dose) of naproxen every 12 hours for the next 3 days. After this time, these medications may be used as needed for pain. Take these medications with food to avoid upset stomach. Choose only one of these medications, do not take them together. Tylenol: Should you continue to have additional pain while taking the ibuprofen or naproxen, you may add in tylenol as needed. Your daily total maximum amount of tylenol from all sources should be limited to 4000mg /day for persons without liver problems, or 2000mg /day for those with liver problems. Vicodin: May take Vicodin (hydrocodone-acetaminophen) as needed for severe pain.   Do not drive or perform other dangerous activities while taking this medication as it can cause drowsiness as well as changes in reaction time and judgement.   Please note that each pill of Vicodin contains 325 mg of acetaminophen (generic for Tylenol) and the above  dosage limits apply.  Other pain management: Many parts of pain management involve experimentation to find what works for you as an individual patient.  You may try lidocaine patches, topical pain relievers, or hot/cold packs.  You may also need to change your sleeping position.  This may involve sleeping propped up in a chair or with extra pillows.  Duration of pain: For bruising or contusions to the ribs, pain can last 4-6 weeks.  For rib fractures, you can expect to have discomfort for 6-12 weeks.  It should be noted that even if there are no obvious fractures on the x-rays, you may have what is called an occult fracture.  This simply means that the bone is broken, but is not broken enough to be noted on x-ray.  Follow-up: Please follow-up with a primary care provider for any further management of this issue.  Any further pain management should also be handled by a primary care provider.  Return: Return to the ED should you begin to have significantly worsening pain, onset of shortness of breath (not just hesitancy to take a deep breath due to pain), fever over 100.3 F accompanied by cough, coughing up blood, or any other major concerns.  For prescription assistance, may try using prescription discount sites or apps, such as goodrx.com   Head Injury You have been seen today for a head injury. It does not appear to be serious at this time.  Close observation: The close observation period is usually 6 hours from the injury. This includes staying awake and having a trustworthy adult monitor you  to assure your condition does not worsen. You should be in regular contact with this person and ideally, they should be able to monitor you in person.  Secondary observation: The secondary observation period is usually 24 hours from the injury. You are allowed to sleep during this time. A trustworthy adult should intermittently monitor you to assure your condition does not worsen.   Overall head  injury/concussion care: Rest: Be sure to get plenty of rest. You will need more rest and sleep while you recover. Hydration: Be sure to stay well hydrated by having a goal of drinking about 0.5 liters of water an hour. Pain:  Antiinflammatory medications: Take 600 mg of ibuprofen every 6 hours or 440 mg (over the counter dose) to 500 mg (prescription dose) of naproxen every 12 hours or for the next 3 days. After this time, these medications may be used as needed for pain. Take these medications with food to avoid upset stomach. Choose only one of these medications, do not take them together. Tylenol: Should you continue to have additional pain while taking the ibuprofen or naproxen, you may add in tylenol as needed. Your daily total maximum amount of tylenol from all sources should be limited to 4000mg /day for persons without liver problems, or 2000mg /day for those with liver problems. Return to sports and activities: In general, you may return to normal activities once symptoms have subsided, however, you would ideally be cleared by a primary care provider or other qualified medical professional prior to return to these activities.  Follow up: Follow up with the concussion clinic or your primary care provider for further management of this issue. Return: Return to the ED should you begin to have confusion, abnormal behavior, aggression, violence, or personality changes, repeated vomiting, vision loss, numbness or weakness on one side of the body, difficulty standing due to dizziness, significantly worsening pain, or any other major concerns.

## 2020-06-05 ENCOUNTER — Other Ambulatory Visit: Payer: Self-pay

## 2020-06-05 DIAGNOSIS — F320 Major depressive disorder, single episode, mild: Secondary | ICD-10-CM

## 2020-06-05 MED ORDER — CITALOPRAM HYDROBROMIDE 20 MG PO TABS: 20.0000 mg | ORAL_TABLET | Freq: Every day | ORAL | 1 refills | Status: DC

## 2020-06-09 ENCOUNTER — Other Ambulatory Visit: Payer: Self-pay

## 2020-06-09 DIAGNOSIS — F320 Major depressive disorder, single episode, mild: Secondary | ICD-10-CM

## 2020-06-09 MED ORDER — CITALOPRAM HYDROBROMIDE 20 MG PO TABS: 20.0000 mg | ORAL_TABLET | Freq: Every day | ORAL | 1 refills | Status: DC

## 2020-06-29 MED ORDER — CITALOPRAM HYDROBROMIDE 20 MG PO TABS: 20.0000 mg | ORAL_TABLET | Freq: Every day | ORAL | 0 refills | Status: DC

## 2020-10-25 ENCOUNTER — Emergency Department (HOSPITAL_BASED_OUTPATIENT_CLINIC_OR_DEPARTMENT_OTHER): Payer: 59

## 2020-10-25 ENCOUNTER — Emergency Department (HOSPITAL_BASED_OUTPATIENT_CLINIC_OR_DEPARTMENT_OTHER)
Admission: EM | Admit: 2020-10-25 | Discharge: 2020-10-25 | Disposition: A | Discharge status: Home / Self Care | Payer: 59 | Attending: Emergency Medicine | Admitting: Emergency Medicine

## 2020-10-25 ENCOUNTER — Other Ambulatory Visit: Payer: Self-pay

## 2020-10-25 ENCOUNTER — Encounter (HOSPITAL_BASED_OUTPATIENT_CLINIC_OR_DEPARTMENT_OTHER): Payer: Self-pay | Admitting: Emergency Medicine

## 2020-10-25 DIAGNOSIS — S8262XA Displaced fracture of lateral malleolus of left fibula, initial encounter for closed fracture: Secondary | ICD-10-CM | POA: Diagnosis not present

## 2020-10-25 DIAGNOSIS — Y92481 Parking lot as the place of occurrence of the external cause: Secondary | ICD-10-CM | POA: Insufficient documentation

## 2020-10-25 DIAGNOSIS — X500XXA Overexertion from strenuous movement or load, initial encounter: Secondary | ICD-10-CM | POA: Diagnosis not present

## 2020-10-25 DIAGNOSIS — S99912A Unspecified injury of left ankle, initial encounter: Secondary | ICD-10-CM | POA: Diagnosis present

## 2020-10-25 DIAGNOSIS — I10 Essential (primary) hypertension: Secondary | ICD-10-CM | POA: Insufficient documentation

## 2020-10-25 NOTE — ED Notes (Signed)
Patient transported to X-ray 

## 2020-10-25 NOTE — ED Provider Notes (Signed)
[facility] EMERGENCY DEPARTMENT Provider Note   CSN: [id number] Arrival date & time: 10/25/20  1619     History Chief Complaint  Patient presents with   Ankle Injury    Emily Franco is a 49 y.o. female.  HPI Patient is a 49 year old female with past medical history of fracture of the right midfoot/metatarsal 5thdigit.  She is presented today with 1 week of achy left ankle pain that is constant, worse with touch and movement.  She states that it began abruptly after she inverted her ankle in a parking lot approximately 1 week ago she fell to the ground states did not sustain any other significant injury other she did bang her head on the ground she did not lose consciousness any nausea vomiting any other significant symptoms.  She she states that her only persistent symptom has been her left ankle pain.  She does endorse some swelling and bruising to the area.  Some pain in her left foot area as well.  She has been able to walk although it was difficult at first.  She denies any numbness or weakness in her ankle and foot.  No other significant symptoms.  No associated symptoms.  No aggravating mitigating factors apart from more pain with walking and some improvement with Tylenol.      Past Medical History:  Diagnosis Date   Anxiety    history of not lately   Depression    Diabetes mellitus without complication (HCC)    gestational   Hyperlipidemia    Hypertension    Neuropathy    Obesity     Patient Active Problem List   Diagnosis Date Noted   Anxiety and depression 02/15/2018   Allergic rhinitis 02/15/2018   HTN (hypertension) 12/30/2015   Family history of coronary artery disease 02/16/2015   Low back pain 02/16/2015   Depression    Hyperlipidemia    Obesity     Past Surgical History:  Procedure Laterality Date   ORIF TOE FRACTURE Right 11/17/2014   Procedure: OPEN REDUCTION INTERNAL FIXATION (ORIF) RIGHT 5TH METATARSAL  FRACTURE;  Surgeon: Cheral Almas, MD;  Location: [facility] OR;  Service: Orthopedics;  Laterality: Right;   WISDOM TOOTH EXTRACTION     "laughing gas"     OB History   No obstetric history on file.     Family History  Problem Relation Age of Onset   Heart disease Father 83       CABG @ 58 y/o   Heart disease Brother 70       CAD @ 50 y/o    Social History   Tobacco Use   Smoking status: Never Smoker   Smokeless tobacco: Never Used  Advertising account planner   Vaping Use: Never used  Substance Use Topics   Alcohol use: Yes    Alcohol/week: 4.0 standard drinks    Types: 4 Glasses of wine per week    Comment: occasional   Drug use: No    Home Medications Prior to Admission medications   Medication Sig Start Date End Date Taking? Authorizing Provider  Acetaminophen-Caffeine (EXCEDRIN TENSION HEADACHE PO) Take by mouth.    [provider]  albuterol (VENTOLIN HFA) 108 (90 Base) MCG/ACT inhaler Inhale 2 puffs into the lungs every 6 (six) hours as needed for wheezing or shortness of breath. 11/15/19   Avegno, Zachery Dakins, FNP  atorvastatin (LIPITOR) 20 MG tablet Take 1 tablet (20 mg total) by mouth daily. 03/09/18   Dorena Bodo,  PA-C  atorvastatin (LIPITOR) 20 MG tablet Take 1 tablet (20 mg total) by mouth daily. 10/29/18   [place], Velna Hatchet, MD  cetirizine (ZYRTEC) 10 MG tablet TAKE 1 TABLET (10 MG TOTAL) BY MOUTH DAILY. 02/15/18   Dorena Bodo, PA-C  citalopram (CELEXA) 20 MG tablet Take 1 tablet (20 mg total) by mouth daily. 06/29/20   Donita Brooks, MD  diclofenac Sodium (VOLTAREN) 1 % GEL Apply 4 g topically 4 (four) times daily. 02/09/20   Joy, Shawn C, PA-C  HYDROcodone-acetaminophen (NORCO/VICODIN) 5-325 MG tablet Take 1 tablet by mouth every 6 (six) hours as needed for severe pain. 02/09/20   Joy, Shawn C, PA-C  ibuprofen (ADVIL,MOTRIN) 600 MG tablet Take 1 tablet (600 mg total) by mouth every 6 (six) hours as needed. 07/29/17   Jacalyn Lefevre, MD  lidocaine (LIDODERM)  5 % Place 1 patch onto the skin daily. Remove & Discard patch within 12 hours or as directed by MD 02/09/20   Anselm Pancoast, PA-C  nitrofurantoin, macrocrystal-monohydrate, (MACROBID) 100 MG capsule Take 1 capsule (100 mg total) by mouth 2 (two) times daily. 11/15/19   AvegnoZachery Dakins, FNP    Allergies    Patient has no known allergies.  Review of Systems   Review of Systems  Constitutional: Negative for fever.  HENT: Negative for congestion.   Respiratory: Negative for shortness of breath.   Cardiovascular: Negative for chest pain.  Gastrointestinal: Negative for abdominal distention.  Musculoskeletal:       Left ankle and foot pain  Neurological: Negative for dizziness and headaches.    Physical Exam Updated Vital Signs BP (!) 133/92    Pulse 72    Temp 98.8 F (37.1 C) (Oral)    Resp 17    Ht 5\' 2"  (1.575 m)    Wt 90.7 kg    SpO2 100%    BMI 36.58 kg/m   Physical Exam Vitals and nursing note reviewed.  Constitutional:      General: She is not in acute distress.    Appearance: Normal appearance. She is not ill-appearing.     Comments: Pleasant 49 year old female appears to be in no acute distress. Denies significant pain.  HENT:     Head: Normocephalic and atraumatic.     Comments: No tenderness palpation over the skull. Head is atraumatic. Eyes:     General: No scleral icterus.       Right eye: No discharge.        Left eye: No discharge.     Conjunctiva/sclera: Conjunctivae normal.  Pulmonary:     Effort: Pulmonary effort is normal.     Breath sounds: Normal breath sounds. No stridor.  Musculoskeletal:     Cervical back: No tenderness.     Comments: Swelling and bruising of the left lateral ankle and swelling that extends into the left lateral midfoot over the proximal fifth metatarsal. There is no significant step-off or deformity but is difficult to fully assess given the significant swelling present.   Point tenderness palpation of the lateral malleolus. No  medial or posterior malleoli tenderness to palpation. Able to wiggle toes and has no other bony tenderness of the lower extremities.  Skin:    General: Skin is warm and dry.     Comments: Bruising over the lateral aspect of the left foot and ankle  Neurological:     Mental Status: She is alert and oriented to person, place, and time. Mental status is at baseline.  Comments: Good sensation bilateral feet at the dorsum and the pinky toe and great toe.  Psychiatric:        Mood and Affect: Mood normal.        Behavior: Behavior normal.     ED Results / Procedures / Treatments   Labs (all labs ordered are listed, but only abnormal results are displayed) Labs Reviewed - No data to display  EKG None  Radiology DG Ankle Complete Left  Result Date: 10/25/2020 CLINICAL DATA:  Twisted ankle 1 week ago. Persistent lateral ankle pain. Initial encounter. EXAM: LEFT ANKLE COMPLETE - 3+ VIEW COMPARISON:  None. FINDINGS: A nondisplaced transverse fracture is seen involving the lateral malleolus, inferior to the level of the tibial plafond. No other fractures are identified. No evidence of dislocation. Lateral soft tissue swelling is noted. Plantar calcaneal bone spur incidentally noted. IMPRESSION: Nondisplaced fracture of lateral malleolus. Electronically Signed   By: Danae Orleans M.D.   On: 10/25/2020 17:18   DG Foot Complete Left  Result Date: 10/25/2020 CLINICAL DATA:  49 year old female with left foot pain. EXAM: LEFT FOOT - COMPLETE 3+ VIEW COMPARISON:  Left ankle radiograph dated 10/25/2020. FINDINGS: There is no acute fracture or dislocation of the foot. Fracture of the lateral malleolus, better seen on the ankle radiograph. Mild diffuse subcutaneous edema. IMPRESSION: No acute fracture or dislocation of the foot. Electronically Signed   By: Elgie Collard M.D.   On: 10/25/2020 18:59    Procedures Procedures (including critical care time)  Medications Ordered in ED Medications - No  data to display  ED Course  I have reviewed the triage vital signs and the nursing notes.  Pertinent labs & imaging results that were available during my care of the patient were reviewed by me and considered in my medical decision making (see chart for details).    MDM Rules/Calculators/A&P                          Patient is 49 year old female presented today after inversion injury that occurred approximately 1 week ago she has swelling bruising and tenderness of the lateral malleolus with some over the midfoot as well. X-rays obtained show very mildly displaced lateral malleolus fracture of the distal fibula. She has no metatarsal fractures or dislocations.  We will provide patient with walking boot and crutches. She has been ambulatory on this for the past 7 days. She will follow up with orthopedic doctor. She has already been established with one and I provided her with information for this orthopedist for her to continue care with them.  She is distally neurovascularly intact. Is agreeable to plan is understanding of return precautions.  Final Clinical Impression(s) / ED Diagnoses Final diagnoses:  Closed displaced fracture of lateral malleolus of left fibula, initial encounter    Rx / DC Orders ED Discharge Orders    None       Gailen Shelter, [place] 10/25/20 2343    Sabas Sous, MD 10/26/20 [phone]

## 2020-10-25 NOTE — ED Triage Notes (Signed)
Twisted her L ankle 1 week ago. She has been wearing a brace that she bought. Pain persists.

## 2020-10-25 NOTE — Discharge Instructions (Addendum)
You have a small very minimally displaced fracture of the left ankle.  Specifically this is over the left lateral malleolus which is the fibula bone. Please rest ice and elevate your leg.  You may alternate between Tylenol and ibuprofen for pain please wear the leg brace as compression.  You may use 600 mg ibuprofen every 6 hours or 1000 mg of Tylenol every 6 hours.  You may choose to alternate between the 2.  This would be most effective.  Not to exceed 4 g of Tylenol within 24 hours.  Not to exceed 3200 mg ibuprofen 24 hours.  I have put the information in your chart for your orthopedist that saw you for your right foot.  I have also given you the information for another orthopedic group if you are unable to contact your orthopedist.

## 2020-11-03 ENCOUNTER — Ambulatory Visit (INDEPENDENT_AMBULATORY_CARE_PROVIDER_SITE_OTHER): Payer: 59 | Admitting: Orthopaedic Surgery

## 2020-11-03 ENCOUNTER — Encounter: Payer: Self-pay | Admitting: Orthopaedic Surgery

## 2020-11-03 ENCOUNTER — Other Ambulatory Visit: Payer: Self-pay

## 2020-11-03 DIAGNOSIS — S8265XA Nondisplaced fracture of lateral malleolus of left fibula, initial encounter for closed fracture: Secondary | ICD-10-CM | POA: Diagnosis not present

## 2020-11-03 MED ORDER — TRAMADOL HCL 50 MG PO TABS: 50.0000 mg | ORAL_TABLET | Freq: Four times a day (QID) | ORAL | 2 refills | Status: DC | PRN

## 2020-11-03 NOTE — Progress Notes (Addendum)
Office Visit Note   Patient: Emily Franco           Date of Birth: August 13, 1971           MRN: [id number] Visit Date: 11/03/2020              Requested by: Donita Brooks, MD [address] [place],  [place] [address] PCP: Donita Brooks, MD   Assessment & Plan: Visit Diagnoses:  1. Nondisplaced fracture of lateral malleolus of left fibula, initial encounter for closed fracture     Plan: Impression is left ankle Weber A distal fibula fracture. This should be amenable to nonoperative treatment. She will continue wearing her cam walker weightbearing as tolerated. She will ice and elevate for pain and swelling. Have called in tramadol to take as needed. Follow-up with [place] in 2 weeks time for repeat evaluation and 3 view x-rays of the left ankle.  Follow-Up Instructions: Return in about 2 weeks (around 11/17/2020).   Orders:  No orders of the defined types were placed in this encounter.  No orders of the defined types were placed in this encounter.     Procedures: No procedures performed   Clinical Data: No additional findings.   Subjective: Chief Complaint  Patient presents with  . Left Ankle - Pain    HPI patient is a pleasant 49 year old female who comes in today following an injury to her left ankle. Approximately 2 weeks ago, she inverted her ankle after stepping on a crack in her brother's driveway. She was seen in the ED where x-rays were obtained. X-rays demonstrated a lateral malleolus fracture. She was placed in a cam walker and provided with crutches. She comes in today for further evaluation and treatment recommendation. She notes her pain has improved. She is able to ambulate in a boot without crutches and with minimal pain.  Review of Systems as detailed in HPI. All others reviewed and are negative.   Objective: Vital Signs: There were no vitals taken for this visit.  Physical Exam well-developed well-nourished female no acute distress. Alert  oriented x3.  Ortho Exam left ankle exam shows moderate swelling. Moderate tenderness at the fracture site. Increased pain with plantar flexion and inversion. She is neurovascular intact distally.  Specialty Comments:  No specialty comments available.  Imaging: No new imaging   PMFS History: Patient Active Problem List   Diagnosis Date Noted  . Anxiety and depression 02/15/2018  . Allergic rhinitis 02/15/2018  . HTN (hypertension) 12/30/2015  . Family history of coronary artery disease 02/16/2015  . Low back pain 02/16/2015  . Depression   . Hyperlipidemia   . Obesity    Past Medical History:  Diagnosis Date  . Anxiety    history of not lately  . Depression   . Diabetes mellitus without complication (HCC)    gestational  . Hyperlipidemia   . Hypertension   . Neuropathy   . Obesity     Family History  Problem Relation Age of Onset  . Heart disease Father 68       CABG @ 66 y/o  . Heart disease Brother 63       CAD @ 54 y/o    Past Surgical History:  Procedure Laterality Date  . ORIF TOE FRACTURE Right 11/17/2014   Procedure: OPEN REDUCTION INTERNAL FIXATION (ORIF) RIGHT 5TH METATARSAL FRACTURE;  Surgeon: Cheral Almas, MD;  Location: [facility] OR;  Service: Orthopedics;  Laterality: Right;  . WISDOM TOOTH  EXTRACTION     "laughing gas"   Social History   Occupational History  . Not on file  Tobacco Use  . Smoking status: Never Smoker  . Smokeless tobacco: Never Used  Vaping Use  . Vaping Use: Never used  Substance and Sexual Activity  . Alcohol use: Yes    Alcohol/week: 4.0 standard drinks    Types: 4 Glasses of wine per week    Comment: occasional  . Drug use: No  . Sexual activity: Not on file

## 2020-11-17 ENCOUNTER — Other Ambulatory Visit: Payer: Self-pay

## 2020-11-17 ENCOUNTER — Ambulatory Visit (INDEPENDENT_AMBULATORY_CARE_PROVIDER_SITE_OTHER): Payer: 59

## 2020-11-17 ENCOUNTER — Encounter: Payer: Self-pay | Admitting: Orthopaedic Surgery

## 2020-11-17 ENCOUNTER — Ambulatory Visit (INDEPENDENT_AMBULATORY_CARE_PROVIDER_SITE_OTHER): Payer: 59 | Admitting: Orthopaedic Surgery

## 2020-11-17 DIAGNOSIS — S8265XA Nondisplaced fracture of lateral malleolus of left fibula, initial encounter for closed fracture: Secondary | ICD-10-CM | POA: Diagnosis not present

## 2020-11-17 NOTE — Progress Notes (Signed)
Office Visit Note   Patient: Emily Franco           Date of Birth: 11-25-1971           MRN: [id number] Visit Date: 11/17/2020              Requested by: Donita Brooks, MD [address] [place],  [place] [address] PCP: Donita Brooks, MD   Assessment & Plan: Visit Diagnoses:  1. Nondisplaced fracture of lateral malleolus of left fibula, initial encounter for closed fracture     Plan: Impression is 4 weeks status post left lateral malleolus ankle fracture.  At this point, she may begin to wean from her cam walker into an ASO brace.  This was provided for her today.  We will also refer her to outpatient physical therapy.  She will follow up with [place] in 4 weeks time for repeat evaluation three-view x-rays of the left ankle.  Call with concerns or questions.  Follow-Up Instructions: Return in about 4 weeks (around 12/15/2020).   Orders:  Orders Placed This Encounter  Procedures  . XR Ankle Complete Left  . Ambulatory referral to Physical Therapy   No orders of the defined types were placed in this encounter.     Procedures: No procedures performed   Clinical Data: No additional findings.   Subjective: Chief Complaint  Patient presents with  . Left Ankle - Pain, Follow-up    HPI patient is a pleasant 49 year old female who comes in today 4 weeks out left ankle lateral malleolus fracture.  She has been doing well.  She has been ambulating in a Cam walker with minimal pain.     Objective: Vital Signs: There were no vitals taken for this visit.    Ortho Exam left ankle exam shows no swelling.  She has mild tenderness at the fracture site.  She has slight increased pain with plantar flexion.  She is neurovascular intact distally.  Specialty Comments:  No specialty comments available.  Imaging: XR Ankle Complete Left  Result Date: 11/17/2020 X-rays demonstrate bony consolidation to the fracture    PMFS History: Patient Active Problem List    Diagnosis Date Noted  . Anxiety and depression 02/15/2018  . Allergic rhinitis 02/15/2018  . HTN (hypertension) 12/30/2015  . Family history of coronary artery disease 02/16/2015  . Low back pain 02/16/2015  . Depression   . Hyperlipidemia   . Obesity    Past Medical History:  Diagnosis Date  . Anxiety    history of not lately  . Depression   . Diabetes mellitus without complication (HCC)    gestational  . Hyperlipidemia   . Hypertension   . Neuropathy   . Obesity     Family History  Problem Relation Age of Onset  . Heart disease Father 86       CABG @ 30 y/o  . Heart disease Brother 34       CAD @ 48 y/o    Past Surgical History:  Procedure Laterality Date  . ORIF TOE FRACTURE Right 11/17/2014   Procedure: OPEN REDUCTION INTERNAL FIXATION (ORIF) RIGHT 5TH METATARSAL FRACTURE;  Surgeon: Cheral Almas, MD;  Location: [facility] OR;  Service: Orthopedics;  Laterality: Right;  . WISDOM TOOTH EXTRACTION     "laughing gas"   Social History   Occupational History  . Not on file  Tobacco Use  . Smoking status: Never Smoker  . Smokeless tobacco: Never Used  Vaping Use  .  Vaping Use: Never used  Substance and Sexual Activity  . Alcohol use: Yes    Alcohol/week: 4.0 standard drinks    Types: 4 Glasses of wine per week    Comment: occasional  . Drug use: No  . Sexual activity: Not on file

## 2020-12-01 ENCOUNTER — Other Ambulatory Visit: Payer: Self-pay

## 2020-12-01 ENCOUNTER — Ambulatory Visit (INDEPENDENT_AMBULATORY_CARE_PROVIDER_SITE_OTHER): Payer: 59 | Admitting: Orthopaedic Surgery

## 2020-12-01 ENCOUNTER — Ambulatory Visit (INDEPENDENT_AMBULATORY_CARE_PROVIDER_SITE_OTHER): Payer: 59

## 2020-12-01 ENCOUNTER — Encounter: Payer: Self-pay | Admitting: Orthopaedic Surgery

## 2020-12-01 DIAGNOSIS — S8265XA Nondisplaced fracture of lateral malleolus of left fibula, initial encounter for closed fracture: Secondary | ICD-10-CM | POA: Diagnosis not present

## 2020-12-01 NOTE — Progress Notes (Signed)
Office Visit Note   Patient: Emily Franco           Date of Birth: 1971/11/24           MRN: [id number] Visit Date: 12/01/2020              Requested by: Donita Brooks, MD [address] [place],  [place] [address] PCP: Donita Brooks, MD   Assessment & Plan: Visit Diagnoses:  1. Nondisplaced fracture of lateral malleolus of left fibula, initial encounter for closed fracture     Plan: Impression is 6 weeks status post left ankle Weber a distal fibula fracture.  At this point, we will provide the patient with an ASO brace.  She may transition into this at this point.  We will also start her in formal physical therapy.  She will follow up with [place] in 4 weeks time for repeat evaluation and 3 view x-rays of the left ankle.  Follow-Up Instructions: Return in about 4 weeks (around 12/29/2020).   Orders:  Orders Placed This Encounter  Procedures  . XR Ankle Complete Left   No orders of the defined types were placed in this encounter.     Procedures: No procedures performed   Clinical Data: No additional findings.   Subjective: Chief Complaint  Patient presents with  . Left Ankle - Follow-up    HPI patient is a pleasant 49 year old female who comes in today 6 weeks out left ankle Weber a distal fibula fracture.  She has been doing well.  She has been compliant weightbearing in a Cam walker.  Minimal pain.     Objective: Vital Signs: There were no vitals taken for this visit.    Ortho Exam left ankle shows mild tenderness to the fracture site.  She still has slight pain with range of motion the ankle.  She is neurovascular intact distally.  Specialty Comments:  No specialty comments available.  Imaging: XR Ankle Complete Left  Result Date: 12/01/2020 Ratio evidence of bony consolidation to the fracture site    PMFS History: Patient Active Problem List   Diagnosis Date Noted  . Anxiety and depression 02/15/2018  . Allergic rhinitis  02/15/2018  . HTN (hypertension) 12/30/2015  . Family history of coronary artery disease 02/16/2015  . Low back pain 02/16/2015  . Depression   . Hyperlipidemia   . Obesity    Past Medical History:  Diagnosis Date  . Anxiety    history of not lately  . Depression   . Diabetes mellitus without complication (HCC)    gestational  . Hyperlipidemia   . Hypertension   . Neuropathy   . Obesity     Family History  Problem Relation Age of Onset  . Heart disease Father 52       CABG @ 95 y/o  . Heart disease Brother 20       CAD @ 60 y/o    Past Surgical History:  Procedure Laterality Date  . ORIF TOE FRACTURE Right 11/17/2014   Procedure: OPEN REDUCTION INTERNAL FIXATION (ORIF) RIGHT 5TH METATARSAL FRACTURE;  Surgeon: Cheral Almas, MD;  Location: [facility] OR;  Service: Orthopedics;  Laterality: Right;  . WISDOM TOOTH EXTRACTION     "laughing gas"   Social History   Occupational History  . Not on file  Tobacco Use  . Smoking status: Never Smoker  . Smokeless tobacco: Never Used  Vaping Use  . Vaping Use: Never used  Substance and Sexual  Activity  . Alcohol use: Yes    Alcohol/week: 4.0 standard drinks    Types: 4 Glasses of wine per week    Comment: occasional  . Drug use: No  . Sexual activity: Not on file

## 2020-12-29 ENCOUNTER — Ambulatory Visit: Payer: 59 | Admitting: Orthopaedic Surgery

## 2020-12-31 ENCOUNTER — Ambulatory Visit: Payer: Self-pay | Admitting: Family Medicine

## 2021-01-05 ENCOUNTER — Ambulatory Visit: Payer: 59 | Admitting: Orthopaedic Surgery

## 2021-01-12 ENCOUNTER — Encounter: Payer: Self-pay | Admitting: Orthopaedic Surgery

## 2021-01-12 ENCOUNTER — Ambulatory Visit (INDEPENDENT_AMBULATORY_CARE_PROVIDER_SITE_OTHER): Payer: 59 | Admitting: Orthopaedic Surgery

## 2021-01-12 ENCOUNTER — Ambulatory Visit (INDEPENDENT_AMBULATORY_CARE_PROVIDER_SITE_OTHER): Payer: 59

## 2021-01-12 ENCOUNTER — Other Ambulatory Visit: Payer: Self-pay

## 2021-01-12 DIAGNOSIS — S8265XA Nondisplaced fracture of lateral malleolus of left fibula, initial encounter for closed fracture: Secondary | ICD-10-CM

## 2021-01-12 NOTE — Progress Notes (Signed)
     Patient: Emily Franco           Date of Birth: [birth year]           MRN: [id number] Visit Date: 01/12/2021 PCP: Donita Brooks, MD   Assessment & Plan:  Chief Complaint:  Chief Complaint  Patient presents with  . Left Ankle - Pain   Visit Diagnoses:  1. Nondisplaced fracture of lateral malleolus of left fibula, initial encounter for closed fracture     Plan:   Jeraldin returns today 10 weeks status post nondisplaced Weber a distal fibula fracture.  She reports no pain and just some occasional swelling.  She wears ASO O brace at times mainly in public.  Feels like she is doing a lot better.  Examination of the left ankle shows mild swelling.  No significant tenderness to palpation of the distal fibula.  Range of motion ankle and subtalar joint are supple.  The x-rays show persistent fracture lucency and slight fracture resorption.  Overall clinically she is doing much better.  Although the fracture is not healed and concerning for delayed union we will continue to monitor this since she is able to ambulate without any real symptoms.  I recommend that she follow-up with [place] in a couple months if she does not feel like she is 100% at that time.  Otherwise we will see her back as needed.  Follow-Up Instructions: Return if symptoms worsen or fail to improve.   Orders:  Orders Placed This Encounter  Procedures  . XR Ankle Complete Left   No orders of the defined types were placed in this encounter.   Imaging: XR Ankle Complete Left  Result Date: 01/12/2021 Persistent fracture lucency of the distal fibula   PMFS History: Patient Active Problem List   Diagnosis Date Noted  . Anxiety and depression 02/15/2018  . Allergic rhinitis 02/15/2018  . HTN (hypertension) 12/30/2015  . Family history of coronary artery disease 02/16/2015  . Low back pain 02/16/2015  . Depression   . Hyperlipidemia   . Obesity    Past Medical History:  Diagnosis Date  . Anxiety     history of not lately  . Depression   . Diabetes mellitus without complication (HCC)    gestational  . Hyperlipidemia   . Hypertension   . Neuropathy   . Obesity     Family History  Problem Relation Age of Onset  . Heart disease Father 22       CABG @ 20 y/o  . Heart disease Brother 23       CAD @ 40 y/o    Past Surgical History:  Procedure Laterality Date  . ORIF TOE FRACTURE Right 11/17/2014   Procedure: OPEN REDUCTION INTERNAL FIXATION (ORIF) RIGHT 5TH METATARSAL FRACTURE;  Surgeon: Cheral Almas, MD;  Location: [facility] OR;  Service: Orthopedics;  Laterality: Right;  . WISDOM TOOTH EXTRACTION     "laughing gas"   Social History   Occupational History  . Not on file  Tobacco Use  . Smoking status: Never Smoker  . Smokeless tobacco: Never Used  Vaping Use  . Vaping Use: Never used  Substance and Sexual Activity  . Alcohol use: Yes    Alcohol/week: 4.0 standard drinks    Types: 4 Glasses of wine per week    Comment: occasional  . Drug use: No  . Sexual activity: Not on file

## 2021-03-08 ENCOUNTER — Other Ambulatory Visit: Payer: Self-pay | Admitting: Family Medicine

## 2021-03-08 DIAGNOSIS — F320 Major depressive disorder, single episode, mild: Secondary | ICD-10-CM

## 2021-06-02 ENCOUNTER — Telehealth: Payer: Self-pay | Admitting: Family Medicine

## 2021-06-02 ENCOUNTER — Other Ambulatory Visit: Payer: Self-pay | Admitting: *Deleted

## 2021-06-02 DIAGNOSIS — F320 Major depressive disorder, single episode, mild: Secondary | ICD-10-CM

## 2021-06-02 MED ORDER — CITALOPRAM HYDROBROMIDE 20 MG PO TABS: 20.0000 mg | ORAL_TABLET | Freq: Every day | ORAL | 0 refills | Status: DC

## 2021-06-02 NOTE — Telephone Encounter (Signed)
Patient called for refill of citalopram (CELEXA) 20 MG tablet [[id number]]  (taking 2 pills per day).   Pharmacy confirmed as [id number] - [place], [place] - [address]  [address], [place] [address]  Phone:  [phone]  Fax:  [phone]  DEA #:  [id number]  Patient will be out of medication this Friday, 6/24; scheduled to see provider 6/27. Requesting courtesy refill to avoid a lapse in doses.  Please advise at [phone].

## 2021-06-02 NOTE — Telephone Encounter (Signed)
Call placed to patient to inquire.   Patient reports that she has increased medication by herself D/T increase in stress.   Advised that she has not been seen by MD, therefore we cannot justify increased dosage.   Prescription sent to pharmacy with original SIG. Advised to keep next appointment to discuss medication changes.

## 2021-06-07 ENCOUNTER — Ambulatory Visit (INDEPENDENT_AMBULATORY_CARE_PROVIDER_SITE_OTHER): Payer: 59 | Admitting: Nurse Practitioner

## 2021-06-07 ENCOUNTER — Other Ambulatory Visit: Payer: Self-pay

## 2021-06-07 ENCOUNTER — Encounter: Payer: Self-pay | Admitting: Nurse Practitioner

## 2021-06-07 VITALS — BP 150/100 | HR 100 | Temp 97.7°F | Wt 231.8 lb

## 2021-06-07 DIAGNOSIS — F411 Generalized anxiety disorder: Secondary | ICD-10-CM

## 2021-06-07 DIAGNOSIS — F32A Depression, unspecified: Secondary | ICD-10-CM

## 2021-06-07 DIAGNOSIS — F339 Major depressive disorder, recurrent, unspecified: Secondary | ICD-10-CM | POA: Diagnosis not present

## 2021-06-07 DIAGNOSIS — F419 Anxiety disorder, unspecified: Secondary | ICD-10-CM | POA: Diagnosis not present

## 2021-06-07 MED ORDER — ESCITALOPRAM OXALATE 10 MG PO TABS: 10.0000 mg | ORAL_TABLET | Freq: Every day | ORAL | 1 refills | Status: DC

## 2021-06-07 MED ORDER — HYDROXYZINE PAMOATE 25 MG PO CAPS: 25.0000 mg | ORAL_CAPSULE | Freq: Three times a day (TID) | ORAL | 1 refills | Status: DC | PRN

## 2021-06-07 NOTE — Assessment & Plan Note (Signed)
Chronic.  PHQ-9 and GAD-7 elevated today.  No SI/HI.  Patient willing to start medication and counseling.  Referral placed to Psychology.  Start escitalopram 10 mg daily and hydroxyzine 25 mg q 8 hours prn panic attack.  Discussed hydroxyzine may make her sleepy and do not take while driving or operating heavy machinery.  Escitalopram may take up to 6 weeks before noticing great difference.  Follow up in 1 month.  

## 2021-06-07 NOTE — Progress Notes (Signed)
Subjective:    Patient ID: Emily Franco, female    DOB: 03-Jan-1971, 50 y.o.   MRN: [id number]  HPI: Emily Franco is a 50 y.o. female presenting for anxiety and panic attacks.   Chief Complaint  Patient presents with   Anxiety    Dealing with grief with loss of son, having trouble sleeping. Finding that that she is beginning to drink more to cope with pain   Staying with son right now - has family for support system.    ANXIETY/STRESS Duration: months Anxious mood: yes  Excessive worrying: yes Irritability: yes  Sweating: yes Nausea: yes Palpitations:yes Hyperventilation: yes Panic attacks: yes; at least every night.  Agoraphobia: no  Obscessions/compulsions: no Depressed mood: yes Depression screen [facility] 2/9 06/07/2021 02/15/2018  Decreased Interest 3 0  Down, Depressed, Hopeless 3 0  PHQ - 2 Score 6 0  Altered sleeping 3 -  Tired, decreased energy 3 -  Change in appetite 3 -  Feeling bad or failure about yourself  2 -  Trouble concentrating 2 -  Moving slowly or fidgety/restless 3 -  Suicidal thoughts 1 -  PHQ-9 Score 23 -  Difficult doing work/chores Very difficult -    GAD 7 : Generalized Anxiety Score 06/07/2021  Nervous, Anxious, on Edge 3  Control/stop worrying 2  Worry too much - different things 2  Trouble relaxing 3  Restless 3  Easily annoyed or irritable 1  Afraid - awful might happen 2  Total GAD 7 Score 16  Anxiety Difficulty Very difficult    Anhedonia: yes Weight changes: yes Insomnia: yes hard to fall asleep  Hypersomnia: yes Fatigue/loss of energy: yes Feelings of worthlessness: yes Feelings of guilt: yes Impaired concentration/indecisiveness: yes Suicidal ideations: no  Crying spells: yes Recent Stressors/Life Changes: yes   Relationship problems: no   Family stress: yes     Financial stress: yes    Job stress: yes    Recent death/loss: yes  Has been on Celexa recently, reports it stopped working.  Has been on Paxil  in the past.   No Known Allergies  Outpatient Encounter Medications as of 06/07/2021  Medication Sig   escitalopram (LEXAPRO) 10 MG tablet Take 1 tablet (10 mg total) by mouth daily.   hydrOXYzine (VISTARIL) 25 MG capsule Take 1 capsule (25 mg total) by mouth every 8 (eight) hours as needed.   [DISCONTINUED] Acetaminophen-Caffeine (EXCEDRIN TENSION HEADACHE PO) Take by mouth.   [DISCONTINUED] albuterol (VENTOLIN HFA) 108 (90 Base) MCG/ACT inhaler Inhale 2 puffs into the lungs every 6 (six) hours as needed for wheezing or shortness of breath.   [DISCONTINUED] atorvastatin (LIPITOR) 20 MG tablet Take 1 tablet (20 mg total) by mouth daily.   [DISCONTINUED] atorvastatin (LIPITOR) 20 MG tablet Take 1 tablet (20 mg total) by mouth daily.   [DISCONTINUED] cetirizine (ZYRTEC) 10 MG tablet TAKE 1 TABLET (10 MG TOTAL) BY MOUTH DAILY.   [DISCONTINUED] citalopram (CELEXA) 20 MG tablet Take 1 tablet (20 mg total) by mouth daily.   [DISCONTINUED] diclofenac Sodium (VOLTAREN) 1 % GEL Apply 4 g topically 4 (four) times daily.   [DISCONTINUED] HYDROcodone-acetaminophen (NORCO/VICODIN) 5-325 MG tablet Take 1 tablet by mouth every 6 (six) hours as needed for severe pain.   [DISCONTINUED] ibuprofen (ADVIL,MOTRIN) 600 MG tablet Take 1 tablet (600 mg total) by mouth every 6 (six) hours as needed.   [DISCONTINUED] lidocaine (LIDODERM) 5 % Place 1 patch onto the skin daily. Remove & Discard patch within 12 hours  or as directed by MD   [DISCONTINUED] nitrofurantoin, macrocrystal-monohydrate, (MACROBID) 100 MG capsule Take 1 capsule (100 mg total) by mouth 2 (two) times daily.   [DISCONTINUED] traMADol (ULTRAM) 50 MG tablet Take 1 tablet (50 mg total) by mouth every 6 (six) hours as needed.   No facility-administered encounter medications on file as of 06/07/2021.    Patient Active Problem List   Diagnosis Date Noted   Depression, recurrent (HCC) 06/07/2021   Generalized anxiety disorder 02/15/2018   Allergic  rhinitis 02/15/2018   HTN (hypertension) 12/30/2015   Family history of coronary artery disease 02/16/2015   Low back pain 02/16/2015   Hyperlipidemia    Obesity     Past Medical History:  Diagnosis Date   Anxiety    history of not lately   Depression    Depression    Diabetes mellitus without complication (HCC)    gestational   Hyperlipidemia    Hypertension    Neuropathy    Obesity     Relevant past medical, surgical, family and social history reviewed and updated as indicated. Interim medical history since our last visit reviewed.  Review of Systems Per HPI unless specifically indicated above     Objective:    BP (!) 150/100   Pulse 100   Temp 97.7 F (36.5 C)   Wt 231 lb 12.8 oz (105.1 kg)   SpO2 99%   BMI 42.40 kg/m   Wt Readings from Last 3 Encounters:  06/07/21 231 lb 12.8 oz (105.1 kg)  10/25/20 200 lb (90.7 kg)  02/09/20 190 lb (86.2 kg)    Physical Exam Vitals and nursing note reviewed.  Constitutional:      General: She is not in acute distress.    Appearance: Normal appearance. She is not toxic-appearing.  Eyes:     General: No scleral icterus.    Extraocular Movements: Extraocular movements intact.  Skin:    General: Skin is warm and dry.     Coloration: Skin is not jaundiced or pale.     Findings: No erythema.  Neurological:     Mental Status: She is alert and oriented to person, place, and time.     Motor: No weakness.     Gait: Gait normal.  Psychiatric:        Mood and Affect: Mood normal.        Behavior: Behavior normal.        Thought Content: Thought content normal.        Judgment: Judgment normal.      Assessment & Plan:   Problem List Items Addressed This Visit       Other   Generalized anxiety disorder - Primary    Chronic.  PHQ-9 and GAD-7 elevated today.  No SI/HI.  Patient willing to start medication and counseling.  Referral placed to Psychology.  Start escitalopram 10 mg daily and hydroxyzine 25 mg q 8 hours prn  panic attack.  Discussed hydroxyzine may make her sleepy and do not take while driving or operating heavy machinery.  Escitalopram may take up to 6 weeks before noticing great difference.  Follow up in 1 month.        Relevant Medications   hydrOXYzine (VISTARIL) 25 MG capsule   escitalopram (LEXAPRO) 10 MG tablet   Depression, recurrent (HCC)    Chronic.  PHQ-9 and GAD-7 elevated today.  No SI/HI.  Patient willing to start medication and counseling.  Referral placed to Psychology.  Start escitalopram 10 mg daily  and hydroxyzine 25 mg q 8 hours prn panic attack.  Discussed hydroxyzine may make her sleepy and do not take while driving or operating heavy machinery.  Escitalopram may take up to 6 weeks before noticing great difference.  Follow up in 1 month.        Relevant Medications   hydrOXYzine (VISTARIL) 25 MG capsule   escitalopram (LEXAPRO) 10 MG tablet     Follow up plan: Return for 1 month mood f/u.

## 2021-06-07 NOTE — Assessment & Plan Note (Signed)
Chronic.  PHQ-9 and GAD-7 elevated today.  No SI/HI.  Patient willing to start medication and counseling.  Referral placed to Psychology.  Start escitalopram 10 mg daily and hydroxyzine 25 mg q 8 hours prn panic attack.  Discussed hydroxyzine may make her sleepy and do not take while driving or operating heavy machinery.  Escitalopram may take up to 6 weeks before noticing great difference.  Follow up in 1 month.

## 2021-06-29 ENCOUNTER — Other Ambulatory Visit: Payer: Self-pay | Admitting: Nurse Practitioner

## 2021-06-29 DIAGNOSIS — F32A Depression, unspecified: Secondary | ICD-10-CM

## 2021-06-29 DIAGNOSIS — F419 Anxiety disorder, unspecified: Secondary | ICD-10-CM

## 2021-07-04 NOTE — Progress Notes (Deleted)
Subjective:    Patient ID: Emily Franco, female    DOB: [birth year], 50 y.o.   MRN: [id number]  HPI: Emily Franco is a 50 y.o. female presenting for mood follow up.  No chief complaint on file.  ANXIETY/STRESS At last visit, we started Lexapro 10 mg and hydroxyzine 25 mg tid prn. Duration: Anxious mood: {Blank single:19197::"yes","no"}  Excessive worrying: {Blank single:19197::"yes","no"} Irritability: {Blank single:19197::"yes","no"}  Sweating: {Blank single:19197::"yes","no"} Nausea: {Blank single:19197::"yes","no"} Palpitations:{Blank single:19197::"yes","no"} Hyperventilation: {Blank single:19197::"yes","no"} Panic attacks: {Blank single:19197::"yes","no"} Agoraphobia: {Blank single:19197::"yes","no"}  Obscessions/compulsions: {Blank single:19197::"yes","no"} Depressed mood: {Blank single:19197::"yes","no"} Depression screen [facility] 2/9 06/07/2021 02/15/2018  Decreased Interest 3 0  Down, Depressed, Hopeless 3 0  PHQ - 2 Score 6 0  Altered sleeping 3 -  Tired, decreased energy 3 -  Change in appetite 3 -  Feeling bad or failure about yourself  2 -  Trouble concentrating 2 -  Moving slowly or fidgety/restless 3 -  Suicidal thoughts 1 -  PHQ-9 Score 23 -  Difficult doing work/chores Very difficult -    GAD 7 : Generalized Anxiety Score 06/07/2021  Nervous, Anxious, on Edge 3  Control/stop worrying 2  Worry too much - different things 2  Trouble relaxing 3  Restless 3  Easily annoyed or irritable 1  Afraid - awful might happen 2  Total GAD 7 Score 16  Anxiety Difficulty Very difficult    Anhedonia: {Blank single:19197::"yes","no"} Weight changes: {Blank single:19197::"yes","no"} Insomnia: {Blank single:19197::"yes","no"} {Blank single:19197::"hard to fall asleep","hard to stay asleep"}  Hypersomnia: {Blank single:19197::"yes","no"} Fatigue/loss of energy: {Blank single:19197::"yes","no"} Feelings of worthlessness: {Blank  single:19197::"yes","no"} Feelings of guilt: {Blank single:19197::"yes","no"} Impaired concentration/indecisiveness: {Blank single:19197::"yes","no"} Suicidal ideations: {Blank single:19197::"yes","no"}  Crying spells: {Blank single:19197::"yes","no"} Recent Stressors/Life Changes: {Blank single:19197::"yes","no"}   Relationship problems: {Blank single:19197::"yes","no"}   Family stress: {Blank single:19197::"yes","no"}     Financial stress: {Blank single:19197::"yes","no"}    Job stress: {Blank single:19197::"yes","no"}    Recent death/loss: {Blank single:19197::"yes","no"}   No Known Allergies  Outpatient Encounter Medications as of 07/05/2021  Medication Sig   escitalopram (LEXAPRO) 10 MG tablet Take 1 tablet (10 mg total) by mouth daily.   hydrOXYzine (VISTARIL) 25 MG capsule Take 1 capsule (25 mg total) by mouth every 8 (eight) hours as needed.   No facility-administered encounter medications on file as of 07/05/2021.    Patient Active Problem List   Diagnosis Date Noted   Depression, recurrent ([place]) 06/07/2021   Generalized anxiety disorder 02/15/2018   Allergic rhinitis 02/15/2018   HTN (hypertension) 12/30/2015   Family history of coronary artery disease 02/16/2015   Low back pain 02/16/2015   Hyperlipidemia    Obesity     Past Medical History:  Diagnosis Date   Anxiety    history of not lately   Depression    Depression    Diabetes mellitus without complication ([place])    gestational   Hyperlipidemia    Hypertension    Neuropathy    Obesity     Relevant past medical, surgical, family and social history reviewed and updated as indicated. Interim medical history since our last visit reviewed.  Review of Systems Per HPI unless specifically indicated above     Objective:    There were no vitals taken for this visit.  Wt Readings from Last 3 Encounters:  06/07/21 231 lb 12.8 oz (105.1 kg)  10/25/20 200 lb (90.7 kg)  02/09/20 190 lb (86.2 kg)    Physical  Exam  Results for orders placed or performed during the hospital encounter  of 11/15/19  Urine culture   Specimen: Urine, Clean Catch  Result Value Ref Range   Specimen Description URINE, CLEAN CATCH    Special Requests      NONE Performed at [facility], [address] [place], [place] [address]    Culture MULTIPLE SPECIES PRESENT, SUGGEST RECOLLECTION (A)    Report Status 11/17/2019 FINAL   Novel Coronavirus, NAA (Hosp order, Send-out to Ref Lab; TAT 18-24 hrs   Specimen: Nasopharyngeal Swab; Respiratory  Result Value Ref Range   SARS-CoV-2, NAA NOT DETECTED NOT DETECTED   Coronavirus Source NASOPHARYNGEAL   POC SARS Coronavirus 2 Ag-ED - Nasal Swab (BD Veritor Kit)  Result Value Ref Range   SARS Coronavirus 2 Ag NEGATIVE NEGATIVE  POC SARS Coronavirus 2 Ag  Result Value Ref Range   SARS Coronavirus 2 Ag NEGATIVE NEGATIVE  POCT urinalysis dip (device)  Result Value Ref Range   Glucose, UA NEGATIVE NEGATIVE mg/dL   Bilirubin Urine NEGATIVE NEGATIVE   Ketones, ur NEGATIVE NEGATIVE mg/dL   Specific Gravity, Urine 1.025 1.005 - 1.030   Hgb urine dipstick TRACE (A) NEGATIVE   pH 6.0 5.0 - 8.0   Protein, ur 30 (A) NEGATIVE mg/dL   Urobilinogen, UA 0.2 0.0 - 1.0 mg/dL   Nitrite NEGATIVE NEGATIVE   Leukocytes,Ua LARGE (A) NEGATIVE      Assessment & Plan:   Problem List Items Addressed This Visit   None    Follow up plan: No follow-ups on file.

## 2021-07-05 ENCOUNTER — Ambulatory Visit: Payer: 59 | Admitting: Nurse Practitioner

## 2021-07-08 ENCOUNTER — Encounter: Payer: Self-pay | Admitting: Nurse Practitioner

## 2021-07-08 ENCOUNTER — Ambulatory Visit (INDEPENDENT_AMBULATORY_CARE_PROVIDER_SITE_OTHER): Payer: 59 | Admitting: Nurse Practitioner

## 2021-07-08 ENCOUNTER — Other Ambulatory Visit: Payer: Self-pay

## 2021-07-08 VITALS — BP 128/98 | HR 94 | Temp 98.7°F | Ht 62.0 in | Wt 236.5 lb

## 2021-07-08 DIAGNOSIS — F339 Major depressive disorder, recurrent, unspecified: Secondary | ICD-10-CM

## 2021-07-08 DIAGNOSIS — R030 Elevated blood-pressure reading, without diagnosis of hypertension: Secondary | ICD-10-CM

## 2021-07-08 DIAGNOSIS — F411 Generalized anxiety disorder: Secondary | ICD-10-CM | POA: Diagnosis not present

## 2021-07-08 DIAGNOSIS — F32A Depression, unspecified: Secondary | ICD-10-CM

## 2021-07-08 MED ORDER — HYDROXYZINE PAMOATE 25 MG PO CAPS: 25.0000 mg | ORAL_CAPSULE | Freq: Three times a day (TID) | ORAL | 1 refills | Status: DC | PRN

## 2021-07-08 MED ORDER — VENLAFAXINE HCL ER 37.5 MG PO CP24: ORAL_CAPSULE | ORAL | 0 refills | Status: DC

## 2021-07-08 NOTE — Progress Notes (Signed)
Subjective:    Patient ID: Emily Franco, female    DOB: [birth year], 50 y.o.   MRN: [id number]  HPI: Emily Franco is a 50 y.o. female presenting for mood follow up.  Chief Complaint  Patient presents with   anxiety and  depression    Has not taken the anxiety med in one wk, feels that it is making her gain weight. Wanting to try something else.   Has not began counseling yet, facility scheduling problem   Insomnia    Sleep medicine is working ok   ANXIETY/STRESS At last visit, we started Lexapro 10 mg daily and hydroxzine 25 mg three times daily as needed.  Stopped taking the Lexapro because it made her gain weight.  The hydroxyzine is helping her sleep a lot better.  She does feel like her anxiety is significantly improved.  She is interested in trying a different daily medication. Duration: chronic Anxious mood: yes  Excessive worrying: yes Irritability: no  Sweating: no Nausea: no Palpitations:no Hyperventilation: no Panic attacks: no Agoraphobia: no  Obscessions/compulsions: no Depressed mood: yes Depression screen [facility] 2/9 07/08/2021 06/07/2021 02/15/2018  Decreased Interest 1 3 0  Down, Depressed, Hopeless 1 3 0  PHQ - 2 Score 2 6 0  Altered sleeping 2 3 -  Tired, decreased energy 2 3 -  Change in appetite 1 3 -  Feeling bad or failure about yourself  2 2 -  Trouble concentrating 1 2 -  Moving slowly or fidgety/restless 0 3 -  Suicidal thoughts 0 1 -  PHQ-9 Score 10 23 -  Difficult doing work/chores Not difficult at all Very difficult -    GAD 7 : Generalized Anxiety Score 07/08/2021 06/07/2021  Nervous, Anxious, on Edge 2 3  Control/stop worrying 2 2  Worry too much - different things 2 2  Trouble relaxing 1 3  Restless 1 3  Easily annoyed or irritable 0 1  Afraid - awful might happen 0 2  Total GAD 7 Score 8 16  Anxiety Difficulty Not difficult at all Very difficult   Anhedonia: yes Weight changes: yes Insomnia: no   Hypersomnia:  no Fatigue/loss of energy: yes Feelings of worthlessness: yes Feelings of guilt: yes Impaired concentration/indecisiveness: no Suicidal ideations: no  Crying spells: no  No Known Allergies  Outpatient Encounter Medications as of 07/08/2021  Medication Sig   venlafaxine XR (EFFEXOR XR) 37.5 MG 24 hr capsule Take 1 capsule (37.5 mg total) by mouth daily with breakfast for 7 days, THEN 2 capsules (75 mg total) daily with breakfast for 21 days.   [DISCONTINUED] hydrOXYzine (VISTARIL) 25 MG capsule Take 1 capsule (25 mg total) by mouth every 8 (eight) hours as needed.   hydrOXYzine (VISTARIL) 25 MG capsule Take 1 capsule (25 mg total) by mouth every 8 (eight) hours as needed.   [DISCONTINUED] escitalopram (LEXAPRO) 10 MG tablet Take 1 tablet (10 mg total) by mouth daily. (Patient not taking: Reported on 07/08/2021)   No facility-administered encounter medications on file as of 07/08/2021.    Patient Active Problem List   Diagnosis Date Noted   Depression, recurrent (HCC) 06/07/2021   Generalized anxiety disorder 02/15/2018   Allergic rhinitis 02/15/2018   HTN (hypertension) 12/30/2015   Family history of coronary artery disease 02/16/2015   Low back pain 02/16/2015   Hyperlipidemia    Obesity     Past Medical History:  Diagnosis Date   Anxiety    history of not lately   Depression  Depression    Diabetes mellitus without complication (HCC)    gestational   Hyperlipidemia    Hypertension    Neuropathy    Obesity     Relevant past medical, surgical, family and social history reviewed and updated as indicated. Interim medical history since our last visit reviewed.  Review of Systems Per HPI unless specifically indicated above     Objective:    BP (!) 128/98   Pulse 94   Temp 98.7 F (37.1 C)   Ht 5\' 2"  (1.575 m)   Wt 236 lb 8 oz (107.3 kg)   SpO2 98%   BMI 43.26 kg/m   Wt Readings from Last 3 Encounters:  07/08/21 236 lb 8 oz (107.3 kg)  06/07/21 231 lb 12.8 oz  (105.1 kg)  10/25/20 200 lb (90.7 kg)    Physical Exam Vitals and nursing note reviewed.  Constitutional:      General: She is not in acute distress.    Appearance: Normal appearance. She is obese. She is not toxic-appearing.  Skin:    General: Skin is warm and dry.     Coloration: Skin is not jaundiced or pale.     Findings: No erythema.  Neurological:     Mental Status: She is alert and oriented to person, place, and time.     Motor: No weakness.     Gait: Gait normal.  Psychiatric:        Mood and Affect: Mood normal.        Behavior: Behavior normal.        Thought Content: Thought content normal.        Judgment: Judgment normal.      Assessment & Plan:   Problem List Items Addressed This Visit       Other   Generalized anxiety disorder    Chronic.  PHQ-9 and GAD-7 both improved, however remain elevated.  No SI/HI.  Referral to Psychology is closed - will refer again today as patient desires counseling.  Will continue hydroxyzine at night time to help with sleep and start venlafaxine for mood.  If stable on 37.5 mg dose after 1 week, increase to 75 mg daily.  Follow up in 1 month.        Relevant Medications   venlafaxine XR (EFFEXOR XR) 37.5 MG 24 hr capsule   hydrOXYzine (VISTARIL) 25 MG capsule   Depression, recurrent (HCC) - Primary    Chronic.  PHQ-9 and GAD-7 both improved, however remain elevated.  No SI/HI.  Referral to Psychology is closed - will refer again today as patient desires counseling.  Will continue hydroxyzine at night time to help with sleep and start venlafaxine for mood.  If stable on 37.5 mg dose after 1 week, increase to 75 mg daily.  Follow up in 1 month.        Relevant Medications   venlafaxine XR (EFFEXOR XR) 37.5 MG 24 hr capsule   hydrOXYzine (VISTARIL) 25 MG capsule   Other Visit Diagnoses     Elevated blood pressure reading in office without diagnosis of hypertension       BP elevated in clinic today.  Prescription given for BP  cuff and patient to monitor BP at home.  Notify 10/27/20 with readings consistently >130/80.        Follow up plan: Return in about 4 weeks (around 08/05/2021) for mood f/u.

## 2021-07-10 NOTE — Assessment & Plan Note (Signed)
Chronic.  PHQ-9 and GAD-7 both improved, however remain elevated.  No SI/HI.  Referral to Psychology is closed - will refer again today as patient desires counseling.  Will continue hydroxyzine at night time to help with sleep and start venlafaxine for mood.  If stable on 37.5 mg dose after 1 week, increase to 75 mg daily.  Follow up in 1 month.  

## 2021-07-10 NOTE — Assessment & Plan Note (Signed)
Chronic.  PHQ-9 and GAD-7 both improved, however remain elevated.  No SI/HI.  Referral to Psychology is closed - will refer again today as patient desires counseling.  Will continue hydroxyzine at night time to help with sleep and start venlafaxine for mood.  If stable on 37.5 mg dose after 1 week, increase to 75 mg daily.  Follow up in 1 month.

## 2021-07-22 ENCOUNTER — Other Ambulatory Visit: Payer: Self-pay | Admitting: Nurse Practitioner

## 2021-07-22 ENCOUNTER — Telehealth: Payer: Self-pay | Admitting: *Deleted

## 2021-07-22 DIAGNOSIS — F411 Generalized anxiety disorder: Secondary | ICD-10-CM

## 2021-07-22 NOTE — Telephone Encounter (Signed)
Received call from patient.   Reports that she does not want to take the Effexor as it causes her excessive weight gain. Reports that she is having issues with insomnia, and Atarax is no longer effective.   Patient reports that she has increased anxiety at night that causes her to not be able to go to sleep, and if she finally does drift off, she does not feel rested. She also does not stay asleep long enough to reach REM.   Reports that she has tried OTC Melatonin and Unisom with no relief.   Patient states that she has exhausted the sleep hygiene tips and has not noted any change in her insomnia.   Please advise.

## 2021-07-22 NOTE — Telephone Encounter (Signed)
She has underlying anxiety that needs to be treated and we have tried 2 medications that she has not liked the side effects of- please see if she would be okay talking with Psychiatry.  In the meantime, we can start trazodone 50 mg 1 tablet PO qhs prn insomnia #30.

## 2021-07-25 ENCOUNTER — Other Ambulatory Visit: Payer: Self-pay | Admitting: Nurse Practitioner

## 2021-07-25 DIAGNOSIS — F411 Generalized anxiety disorder: Secondary | ICD-10-CM

## 2021-07-26 ENCOUNTER — Telehealth: Payer: Self-pay

## 2021-07-26 NOTE — Telephone Encounter (Signed)
Call placed to pt regarding sleep meds. PCP is suggesting specialty services for anxiety. Waiting for pt to return call to discuss

## 2021-07-26 NOTE — Telephone Encounter (Signed)
Called pt to speak with her regarding NP's suggestion. Left vm.

## 2021-07-28 MED ORDER — TRAZODONE HCL 50 MG PO TABS: 25.0000 mg | ORAL_TABLET | Freq: Every evening | ORAL | 0 refills | Status: DC | PRN

## 2021-07-28 NOTE — Addendum Note (Signed)
Addended by: Phillips Odor on: 07/28/2021 02:23 PM   Modules accepted: Orders

## 2021-07-28 NOTE — Telephone Encounter (Signed)
Received call from patient.   Patient noted agitated as she states that she did not receive any call back. Apologized for delay in response and reviewed provider recommendations.   Agreeable to plan. Prescription sent to pharmacy.

## 2021-08-05 ENCOUNTER — Other Ambulatory Visit: Payer: Self-pay

## 2021-08-05 ENCOUNTER — Ambulatory Visit (INDEPENDENT_AMBULATORY_CARE_PROVIDER_SITE_OTHER): Payer: 59 | Admitting: Family Medicine

## 2021-08-05 ENCOUNTER — Encounter: Payer: Self-pay | Admitting: Family Medicine

## 2021-08-05 VITALS — BP 122/64 | HR 88 | Temp 97.8°F | Resp 16 | Ht 62.0 in | Wt 235.0 lb

## 2021-08-05 DIAGNOSIS — G629 Polyneuropathy, unspecified: Secondary | ICD-10-CM | POA: Diagnosis not present

## 2021-08-05 NOTE — Progress Notes (Signed)
Subjective:    Patient ID: Emily Franco, female    DOB: [birth year], 50 y.o.   MRN: [id number]  HPI Patient is a very pleasant 50 year old Caucasian female who presents today complaining of balance issues.  She states that gradually over the last 2 years she has noticed that her balance is started to deteriorate.  She states that she is fallen on several occasions.  She is actually fractured both of her feet due to falls and injuries.  She states that she does not feel steady on her feet and she feels unsteady walking down the hallway.  She feels like she needs to reach out to catch her self.  She does report some numbness and tingling in both feet.  She also reports weakness in both legs.  She denies any trouble swallowing or trouble breathing.  She denies any diplopia.  She denies any easy fatigability.  She denies any jerking or tremor.  She denies any slurred speech or facial droop or pill-rolling tremor.  On exam, the patient has a difficult time stepping up onto the exam table.  She has to push up with her hands as well as her legs.  She has normal reflexes checked at the biceps, triceps, and brachioradialis.  She has normal reflexes checked at the patella however her ankles are reflexes absent.  Muscle strength is 5/5 equal and symmetric in both legs to gross examination.  There is no obvious ataxia. Past Medical History  Diagnosis Date   Hypertension    Depression    Hyperlipidemia    Obesity    Current Outpatient Prescriptions on File Prior to Visit  Medication Sig Dispense Refill   atorvastatin (LIPITOR) 80 MG tablet Take 1 tablet (80 mg total) by mouth daily.  30 tablet  1   No current facility-administered medications on file prior to visit.   No Known Allergies History   Social History   Marital Status: Married    Spouse Name: N/A    Number of Children: N/A   Years of Education: N/A   Occupational History   Not on file.   Social History Main Topics   Smoking  status: Never Smoker    Smokeless tobacco: Not on file   Alcohol Use: Yes     Comment: occasional   Drug Use: No   Sexual Activity: Not on file   Other Topics Concern   Not on file   Social History Narrative   No narrative on file     Review of Systems  All other systems reviewed and are negative.     Objective:   Physical Exam Vitals reviewed.  Constitutional:      General: She is not in acute distress.    Appearance: Normal appearance. She is obese. She is not ill-appearing or toxic-appearing.  Cardiovascular:     Rate and Rhythm: Normal rate and regular rhythm.     Heart sounds: Normal heart sounds. No murmur heard.   No friction rub. No gallop.  Pulmonary:     Effort: Pulmonary effort is normal. No respiratory distress.     Breath sounds: Normal breath sounds. No stridor. No wheezing, rhonchi or rales.  Neurological:     General: No focal deficit present.     Mental Status: She is alert and oriented to person, place, and time. Mental status is at baseline.     Cranial Nerves: No cranial nerve deficit.     Sensory: No sensory deficit.     Motor:  No weakness.     Coordination: Coordination abnormal.     Gait: Gait normal.     Deep Tendon Reflexes: Reflexes normal.  Psychiatric:        Mood and Affect: Mood normal.        Behavior: Behavior normal.        Thought Content: Thought content normal.        Judgment: Judgment normal.  Normal sensation to vibration as well as 10 mg monofilament in both feet        Assessment & Plan:  Neuropathy - Plan: CBC with Differential/Platelet, COMPLETE METABOLIC PANEL WITH GFR, TSH, Hemoglobin A1c, Vitamin B12 I am concerned that the patient's falls and balance issues could be a combination of elevated BMI coupled with muscle weakness coupled with neuropathy.  I recommended checking a CBC CMP TSH A1c and vitamin B12 to evaluate for causes of neuropathy.  If lab work is normal, I will consult neurology for EMG/nerve conduction  studies to evaluate for sensorimotor polyneuropathy.

## 2021-08-06 LAB — CBC WITH DIFFERENTIAL/PLATELET
Absolute Monocytes: 482 cells/uL (ref 200–950)
Basophils Absolute: 58 cells/uL (ref 0–200)
Basophils Relative: 1.1 %
Eosinophils Absolute: 233 cells/uL (ref 15–500)
Eosinophils Relative: 4.4 %
HCT: 42.7 % (ref 35.0–45.0)
Hemoglobin: 14.3 g/dL (ref 11.7–15.5)
Lymphs Abs: 1579 cells/uL (ref 850–3900)
MCH: 29.5 pg (ref 27.0–33.0)
MCHC: 33.5 g/dL (ref 32.0–36.0)
MCV: 88.2 fL (ref 80.0–100.0)
MPV: 8.8 fL (ref 7.5–12.5)
Monocytes Relative: 9.1 %
Neutro Abs: 2947 cells/uL (ref 1500–7800)
Neutrophils Relative %: 55.6 %
Platelets: 398 10*3/uL (ref 140–400)
RBC: 4.84 10*6/uL (ref 3.80–5.10)
RDW: 13 % (ref 11.0–15.0)
Total Lymphocyte: 29.8 %
WBC: 5.3 10*3/uL (ref 3.8–10.8)

## 2021-08-06 LAB — COMPLETE METABOLIC PANEL WITH GFR
AG Ratio: 1.6 (calc) (ref 1.0–2.5)
ALT: 23 U/L (ref 6–29)
AST: 14 U/L (ref 10–35)
Albumin: 3.9 g/dL (ref 3.6–5.1)
Alkaline phosphatase (APISO): 102 U/L (ref 37–153)
BUN: 9 mg/dL (ref 7–25)
CO2: 27 mmol/L (ref 20–32)
Calcium: 9.4 mg/dL (ref 8.6–10.4)
Chloride: 105 mmol/L (ref 98–110)
Creat: 0.61 mg/dL (ref 0.50–1.03)
Globulin: 2.5 g/dL (calc) (ref 1.9–3.7)
Glucose, Bld: 87 mg/dL (ref 65–99)
Potassium: 4.2 mmol/L (ref 3.5–5.3)
Sodium: 139 mmol/L (ref 135–146)
Total Bilirubin: 0.3 mg/dL (ref 0.2–1.2)
Total Protein: 6.4 g/dL (ref 6.1–8.1)
eGFR: 108 mL/min/{1.73_m2} (ref >=60)

## 2021-08-06 LAB — TSH: TSH: 2.23 mIU/L

## 2021-08-06 LAB — VITAMIN B12: Vitamin B-12: 1474 pg/mL — ABNORMAL HIGH (ref 200–1100)

## 2021-08-06 LAB — HEMOGLOBIN A1C
Hgb A1c MFr Bld: 4.9 % of total Hgb (ref <5.7)
Mean Plasma Glucose: 94 mg/dL
eAG (mmol/L): 5.2 mmol/L

## 2021-08-19 ENCOUNTER — Other Ambulatory Visit: Payer: Self-pay | Admitting: Nurse Practitioner

## 2021-09-16 ENCOUNTER — Telehealth: Payer: Self-pay | Admitting: *Deleted

## 2021-09-16 NOTE — Telephone Encounter (Signed)
Received call from patient.   Reports that she is currently taking Trazodone Q HS for anxiety, but she is still having increased panic attacks throughout the day.   Inquired if there is anything else she can take during the day. Please advise.

## 2021-09-17 NOTE — Telephone Encounter (Signed)
Call placed to patient and patient made aware per VM.  

## 2021-11-29 ENCOUNTER — Emergency Department (HOSPITAL_BASED_OUTPATIENT_CLINIC_OR_DEPARTMENT_OTHER)
Admission: EM | Admit: 2021-11-29 | Discharge: 2021-11-29 | Disposition: A | Discharge status: Home / Self Care | Payer: 59 | Attending: Emergency Medicine | Admitting: Emergency Medicine

## 2021-11-29 ENCOUNTER — Other Ambulatory Visit: Payer: Self-pay

## 2021-11-29 ENCOUNTER — Encounter (HOSPITAL_BASED_OUTPATIENT_CLINIC_OR_DEPARTMENT_OTHER): Payer: Self-pay

## 2021-11-29 DIAGNOSIS — I10 Essential (primary) hypertension: Secondary | ICD-10-CM | POA: Insufficient documentation

## 2021-11-29 DIAGNOSIS — Z20822 Contact with and (suspected) exposure to covid-19: Secondary | ICD-10-CM | POA: Insufficient documentation

## 2021-11-29 DIAGNOSIS — J069 Acute upper respiratory infection, unspecified: Secondary | ICD-10-CM | POA: Insufficient documentation

## 2021-11-29 DIAGNOSIS — E1140 Type 2 diabetes mellitus with diabetic neuropathy, unspecified: Secondary | ICD-10-CM | POA: Insufficient documentation

## 2021-11-29 LAB — RESP PANEL BY RT-PCR (FLU A&B, COVID) ARPGX2
Influenza A by PCR: NEGATIVE
Influenza B by PCR: NEGATIVE
SARS Coronavirus 2 by RT PCR: NEGATIVE

## 2021-11-29 MED ORDER — BENZONATATE 100 MG PO CAPS: 100.0000 mg | ORAL_CAPSULE | Freq: Three times a day (TID) | ORAL | 0 refills | Status: DC | PRN

## 2021-11-29 NOTE — ED Provider Notes (Signed)
[facility] EMERGENCY DEPARTMENT Provider Note   CSN: [id number] Arrival date & time: 11/29/21  1640     History Chief Complaint  Patient presents with   Cough    Emily Franco is a 50 y.o. female.  HPI Patient is a 50 year old female with a medical history as noted below.  She presents to the emergency department with cough, rhinorrhea, sore throat, fatigue for the past 4 days.  States that her symptoms have been waxing and waning.  No chest pain or shortness of breath.    Past Medical History:  Diagnosis Date   Anxiety    history of not lately   Depression    Depression    Diabetes mellitus without complication (HCC)    gestational   Hyperlipidemia    Hypertension    Neuropathy    Obesity     Patient Active Problem List   Diagnosis Date Noted   Depression, recurrent (HCC) 06/07/2021   Generalized anxiety disorder 02/15/2018   Allergic rhinitis 02/15/2018   HTN (hypertension) 12/30/2015   Family history of coronary artery disease 02/16/2015   Low back pain 02/16/2015   Hyperlipidemia    Obesity     Past Surgical History:  Procedure Laterality Date   ORIF TOE FRACTURE Right 11/17/2014   Procedure: OPEN REDUCTION INTERNAL FIXATION (ORIF) RIGHT 5TH METATARSAL FRACTURE;  Surgeon: Cheral Almas, MD;  Location: [facility] OR;  Service: Orthopedics;  Laterality: Right;   WISDOM TOOTH EXTRACTION     "laughing gas"     OB History   No obstetric history on file.     Family History  Problem Relation Age of Onset   Heart disease Father 11       CABG @ 1 y/o   Heart disease Brother 22       CAD @ 90 y/o    Social History   Tobacco Use   Smoking status: Never   Smokeless tobacco: Never  Vaping Use   Vaping Use: Never used  Substance Use Topics   Alcohol use: Yes    Alcohol/week: 4.0 standard drinks    Types: 4 Glasses of wine per week    Comment: occasional   Drug use: Yes    Types: Marijuana    Home Medications Prior to Admission  medications   Medication Sig Start Date End Date Taking? Authorizing Provider  benzonatate (TESSALON) 100 MG capsule Take 1 capsule (100 mg total) by mouth 3 (three) times daily as needed for cough. 11/29/21  Yes Placido Sou, PA-C  traZODone (DESYREL) 50 MG tablet TAKE 1/2-1 TABLETS BY MOUTH AT BEDTIME AS NEEDED FOR SLEEP. 08/20/21   Donita Brooks, MD    Allergies    Patient has no known allergies.  Review of Systems   Review of Systems  Constitutional:  Positive for fatigue. Negative for chills and fever.  HENT:  Positive for congestion, postnasal drip, rhinorrhea and sore throat.   Respiratory:  Positive for cough. Negative for shortness of breath.   Cardiovascular:  Negative for chest pain.  Gastrointestinal:  Negative for nausea and vomiting.   Physical Exam Updated Vital Signs BP (!) 141/100 (BP Location: Left Arm)    Pulse 85    Temp 98 F (36.7 C) (Oral)    Resp 20    Ht 5\' 2"  (1.575 m)    Wt 104.8 kg    LMP 04/01/2013    SpO2 100%    BMI 42.25 kg/m   Physical Exam Vitals and  nursing note reviewed.  Constitutional:      General: She is not in acute distress.    Appearance: Normal appearance. She is well-developed. She is not ill-appearing, toxic-appearing or diaphoretic.  HENT:     Head: Normocephalic and atraumatic.     Right Ear: External ear normal.     Left Ear: External ear normal.     Nose: Nose normal.     Mouth/Throat:     Mouth: Mucous membranes are moist.     Pharynx: Oropharynx is clear. No oropharyngeal exudate or posterior oropharyngeal erythema.  Eyes:     General: No scleral icterus.       Right eye: No discharge.        Left eye: No discharge.     Extraocular Movements: Extraocular movements intact.     Conjunctiva/sclera: Conjunctivae normal.  Neck:     Trachea: No tracheal deviation.  Cardiovascular:     Rate and Rhythm: Normal rate and regular rhythm.     Pulses: Normal pulses.     Heart sounds: Normal heart sounds. No murmur heard.    No friction rub. No gallop.     Comments: RRR without M/R/G. Pulmonary:     Effort: Pulmonary effort is normal. No respiratory distress.     Breath sounds: Normal breath sounds. No stridor. No wheezing, rhonchi or rales.     Comments: LCTAB Abdominal:     General: Abdomen is flat. There is no distension.     Tenderness: There is no abdominal tenderness.  Musculoskeletal:        General: No swelling or deformity. Normal range of motion.     Cervical back: Normal range of motion and neck supple. No tenderness.  Skin:    General: Skin is warm and dry.     Findings: No rash.  Neurological:     General: No focal deficit present.     Mental Status: She is alert and oriented to person, place, and time.     Cranial Nerves: Cranial nerve deficit: no gross deficits.  Psychiatric:        Mood and Affect: Mood normal.        Behavior: Behavior normal.   ED Results / Procedures / Treatments   Labs (all labs ordered are listed, but only abnormal results are displayed) Labs Reviewed  RESP PANEL BY RT-PCR (FLU A&B, COVID) [id number]    EKG None  Radiology No results found.  Procedures Procedures   Medications Ordered in ED Medications - No data to display  ED Course  I have reviewed the triage vital signs and the nursing notes.  Pertinent labs & imaging results that were available during my care of the patient were reviewed by me and considered in my medical decision making (see chart for details).    MDM Rules/Calculators/A&P                          Patient is a 50 year old female who presents to the emergency department with symptoms consistent with a viral URI with cough.  Respiratory panel obtained in triage which was negative for flu A, flu B, and COVID-19.  Physical exam is reassuring.  Uvula midline.  No erythema or exudates noted in the posterior oropharynx.  Lungs are clear to auscultation bilaterally.  Heart is regular rate and rhythm without murmurs, rubs, or  gallops.  Will discharge patient on a course of Tessalon Perles.  Patient given a work note.  We  discussed return precautions.  Her questions were answered and she was amicable at the time of discharge.  Final Clinical Impression(s) / ED Diagnoses Final diagnoses:  Viral URI with cough   Rx / DC Orders ED Discharge Orders          Ordered    benzonatate (TESSALON) 100 MG capsule  3 times daily PRN        11/29/21 2013             Placido Sou, PA-C 11/29/21 2020    Charlynne Pander, MD 12/03/21 1155

## 2021-11-29 NOTE — Discharge Instructions (Addendum)
I am prescribing you a cough medication called Tessalon Perles.  You can take these up to 3 times a day as needed for your cough.  Please only take them as prescribed.  Please continue to monitor your symptoms closely.  If you develop any new or worsening symptoms please come back to the emergency department.

## 2021-11-29 NOTE — ED Triage Notes (Signed)
Pt c/o flu like sx x 4 days with +flu exposure-NAD-steady gait

## 2021-12-17 ENCOUNTER — Emergency Department (HOSPITAL_BASED_OUTPATIENT_CLINIC_OR_DEPARTMENT_OTHER): Payer: 59

## 2021-12-17 ENCOUNTER — Emergency Department (HOSPITAL_BASED_OUTPATIENT_CLINIC_OR_DEPARTMENT_OTHER)
Admission: EM | Admit: 2021-12-17 | Discharge: 2021-12-17 | Disposition: A | Discharge status: Home / Self Care | Payer: 59 | Attending: Emergency Medicine | Admitting: Emergency Medicine

## 2021-12-17 ENCOUNTER — Other Ambulatory Visit: Payer: Self-pay

## 2021-12-17 ENCOUNTER — Encounter (HOSPITAL_BASED_OUTPATIENT_CLINIC_OR_DEPARTMENT_OTHER): Payer: Self-pay | Admitting: *Deleted

## 2021-12-17 DIAGNOSIS — W182XXA Fall in (into) shower or empty bathtub, initial encounter: Secondary | ICD-10-CM | POA: Diagnosis not present

## 2021-12-17 DIAGNOSIS — S86911A Strain of unspecified muscle(s) and tendon(s) at lower leg level, right leg, initial encounter: Secondary | ICD-10-CM | POA: Diagnosis not present

## 2021-12-17 DIAGNOSIS — S86912A Strain of unspecified muscle(s) and tendon(s) at lower leg level, left leg, initial encounter: Secondary | ICD-10-CM | POA: Diagnosis not present

## 2021-12-17 DIAGNOSIS — S8991XA Unspecified injury of right lower leg, initial encounter: Secondary | ICD-10-CM | POA: Diagnosis present

## 2021-12-17 MED ORDER — IBUPROFEN 400 MG PO TABS
600.0000 mg | ORAL_TABLET | Freq: Once | ORAL | Status: AC
  Administered 2021-12-17: 600 mg via ORAL
  Filled 2021-12-17: qty 1

## 2021-12-17 MED ORDER — CITALOPRAM HYDROBROMIDE 20 MG PO TABS: 20.0000 mg | ORAL_TABLET | Freq: Every day | ORAL | 1 refills | Status: DC

## 2021-12-17 NOTE — ED Provider Notes (Signed)
[place] EMERGENCY DEPARTMENT Provider Note   CSN: [id number] Arrival date & time: 12/17/21  1636     History  Chief Complaint  Patient presents with   Fall   Knee Pain    Emily Franco is a 51 y.o. female.  Presents to ER due to fall in tub yesterday.  Reports that she slipped and had thinks that she injured both of her knees.  Has been able to walk.  Pain is worse with movement and improved with rest.  Aching.  At times worse on the left but currently equal.  Denies hitting her head, no loss of consciousness.  HPI     Home Medications Prior to Admission medications   Medication Sig Start Date End Date Taking? Authorizing Provider  citalopram (CELEXA) 20 MG tablet Take 1 tablet (20 mg total) by mouth daily. 12/17/21  Yes Susy Frizzle, MD  traZODone (DESYREL) 50 MG tablet TAKE 1/2-1 TABLETS BY MOUTH AT BEDTIME AS NEEDED FOR SLEEP. 08/20/21  Yes Susy Frizzle, MD  benzonatate (TESSALON) 100 MG capsule Take 1 capsule (100 mg total) by mouth 3 (three) times daily as needed for cough. 11/29/21   Rayna Sexton, PA-C      Allergies    Patient has no known allergies.    Review of Systems   Review of Systems  Musculoskeletal:  Positive for arthralgias.  All other systems reviewed and are negative.  Physical Exam Updated Vital Signs BP (!) 127/92    Pulse 90    Temp 98.9 F (37.2 C) (Oral)    Resp 14    Ht 5\' 2"  (1.575 m)    Wt 104.8 kg    LMP 04/01/2013    SpO2 96%    BMI 42.26 kg/m  Physical Exam Vitals and nursing note reviewed.  Constitutional:      General: She is not in acute distress.    Appearance: She is well-developed.  HENT:     Head: Normocephalic and atraumatic.  Eyes:     Conjunctiva/sclera: Conjunctivae normal.  Cardiovascular:     Rate and Rhythm: Normal rate and regular rhythm.     Heart sounds: No murmur heard. Pulmonary:     Effort: Pulmonary effort is normal. No respiratory distress.     Breath sounds: Normal breath sounds.   Abdominal:     Palpations: Abdomen is soft.     Tenderness: There is no abdominal tenderness.  Musculoskeletal:        General: No swelling.     Cervical back: Neck supple.     Comments: Back: no C, T, L spine TTP, no step off or deformity RUE: Noted very superficial abrasion over the dorsal aspect of the distal fifth digit, no focal bony tenderness, no other abnormality or deformity, normal joint ROM, radial pulse intact, distal sensation and motor intact LUE: no TTP throughout, no deformity, normal joint ROM, radial pulse intact, distal sensation and motor intact RLE: Some tenderness to the knee, no deformity, normal joint ROM, distal pulse, sensation and motor intact LLE: Some tenderness to the knee, no deformity, normal joint ROM, distal pulse, sensation and motor intact  Skin:    General: Skin is warm and dry.     Capillary Refill: Capillary refill takes less than 2 seconds.  Neurological:     Mental Status: She is alert.  Psychiatric:        Mood and Affect: Mood normal.    ED Results / Procedures / Treatments  Labs (all labs ordered are listed, but only abnormal results are displayed) Labs Reviewed - No data to display  EKG None  Radiology DG Knee Complete 4 Views Left  Result Date: 12/17/2021 CLINICAL DATA:  Injury 2 knees.  Fell on [employer] EXAM: LEFT KNEE - COMPLETE 4+ VIEW COMPARISON:  None. FINDINGS: No joint effusion. Os ossific density adjacent to the medial femoral condyle is identified compatible with Pellegrini-Stieda lesion. This is compatible with sequelae of age-indeterminate medial collateral ligament injury. No additional fracture or dislocation identified. Mild degenerative changes noted. IMPRESSION: 1. No acute findings. 2. 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 Electronically Signed   By: Kerby Moors M.D.   On: 12/17/2021 21:35   DG Knee Complete 4 Views Right  Result Date:  12/17/2021 CLINICAL DATA:  Slipped and fell, knee pain EXAM: RIGHT KNEE - COMPLETE 4+ VIEW COMPARISON:  01/06/2015 FINDINGS: Frontal, bilateral oblique, lateral views of the right knee are obtained. No acute fracture, subluxation, or dislocation. Joint spaces are relatively well preserved. No joint effusion. Soft tissues are unremarkable. IMPRESSION: 1. Unremarkable right knee. Electronically Signed   By: Randa Ngo M.D.   On: 12/17/2021 21:34    Procedures Procedures    Medications Ordered in ED Medications  ibuprofen (ADVIL) tablet 600 mg (600 mg Oral Given 12/17/21 2130)    ED Course/ Medical Decision Making/ A&P                           Medical Decision Making  51 year old lady presented to ER with concern for bilateral knee pain after injury slipping in the tub yesterday.  On exam noted mild tenderness to both knees but no significant deformity appreciated.  She also had very superficial abrasion to her right fifth digit on her hand.  No focal bony tenderness appreciated in her hand.  Plain films obtained, per my review and per radiology report negative for fracture or dislocation.  Provided some Motrin.  Improvement in pain.  Will discharge home.  After the discussed management above, the patient was determined to be safe for discharge.  The patient was in agreement with this plan and all questions regarding their care were answered.  ED return precautions were discussed and the patient will return to the ED with any significant worsening of condition.         Final Clinical Impression(s) / ED Diagnoses Final diagnoses:  Strain of both knees, initial encounter    Rx / DC Orders ED Discharge Orders     None         Lucrezia Starch, MD 12/17/21 2346

## 2021-12-17 NOTE — Discharge Instructions (Signed)
Take Tylenol or Motrin for pain control.  Follow-up with Ortho or sports medicine next week if symptoms not resolving.  Recommend rest, ice and elevation.  Bear weight as tolerated.

## 2021-12-17 NOTE — ED Triage Notes (Signed)
She slipped getting into the tub yesterday. Injury to her knees. More pain on the left. This am she lost her balance and fell onto her porch and concrete. Pain in her right hand with abrasion noted.

## 2021-12-25 ENCOUNTER — Other Ambulatory Visit: Payer: Self-pay | Admitting: Family Medicine

## 2022-04-16 IMAGING — DX DG FOOT COMPLETE 3+V*L*
3 series · 3 of 3 positions shown · non-contrast
Comparison: Left ankle radiograph dated 10/25/2020.

CLINICAL DATA: 49-year-old female with left foot pain.

EXAM:
LEFT FOOT - COMPLETE 3+ VIEW

[foot ap]
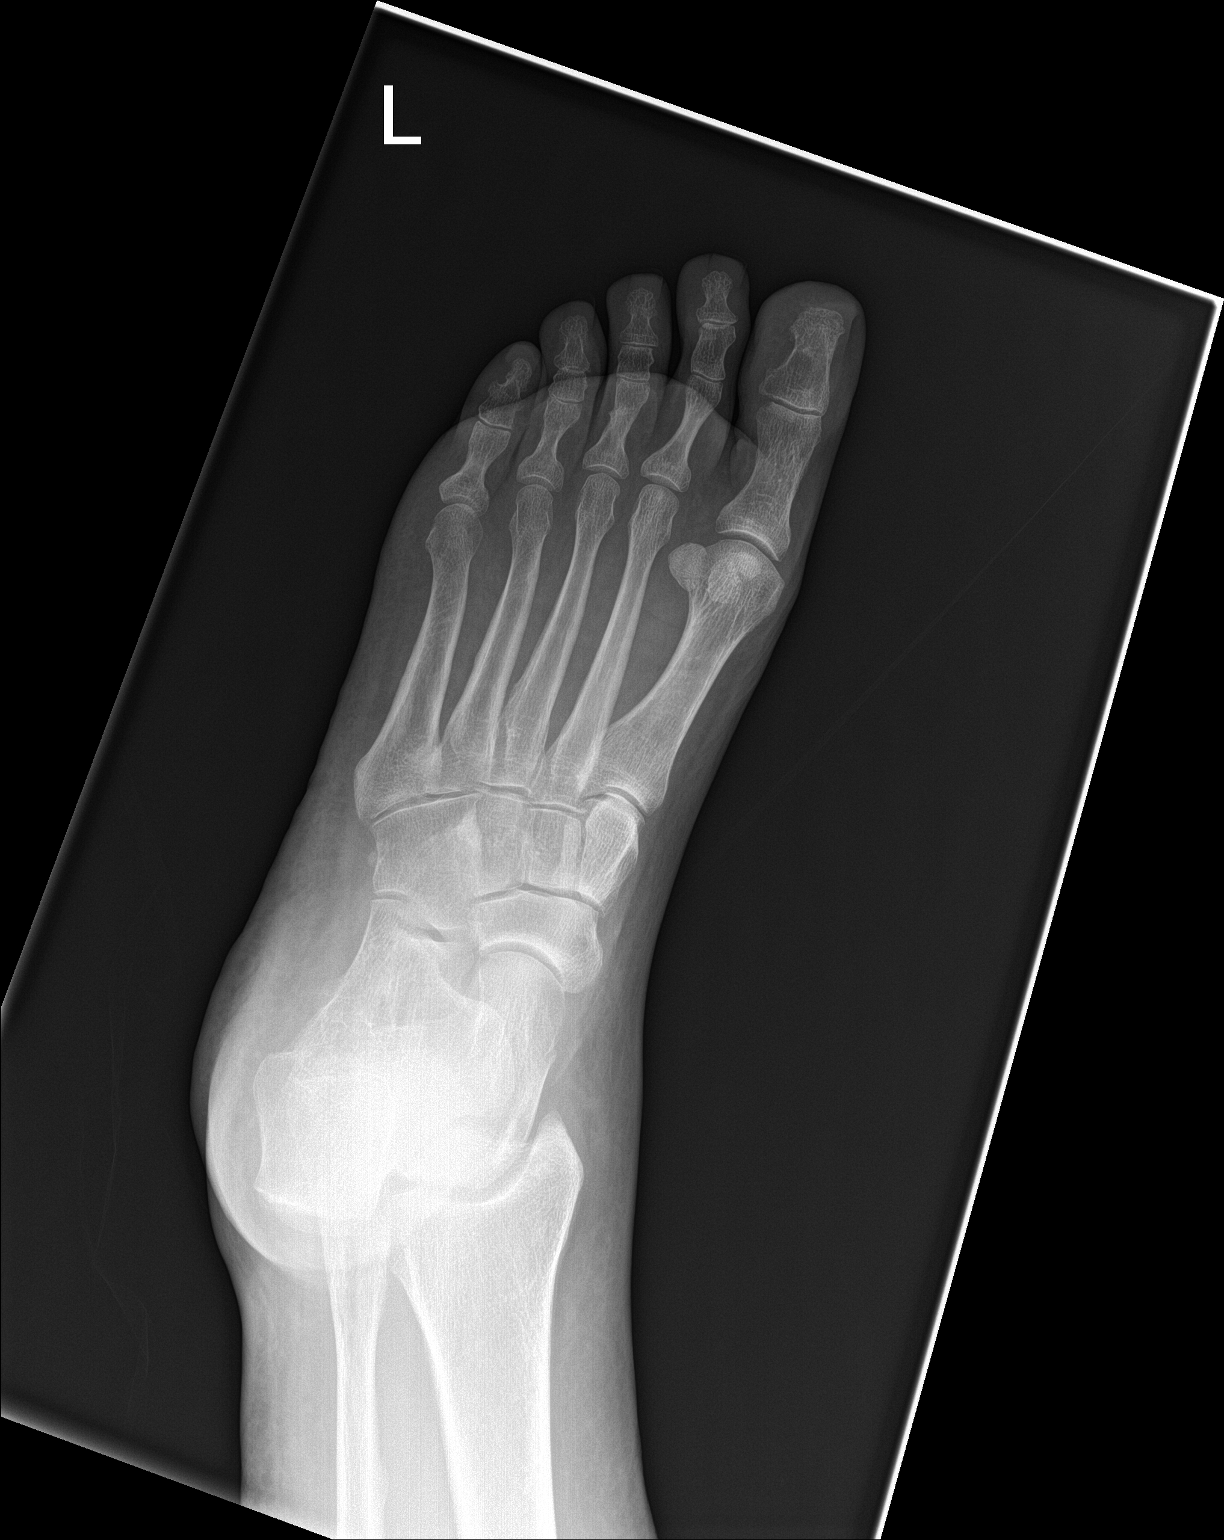

[foot lat]
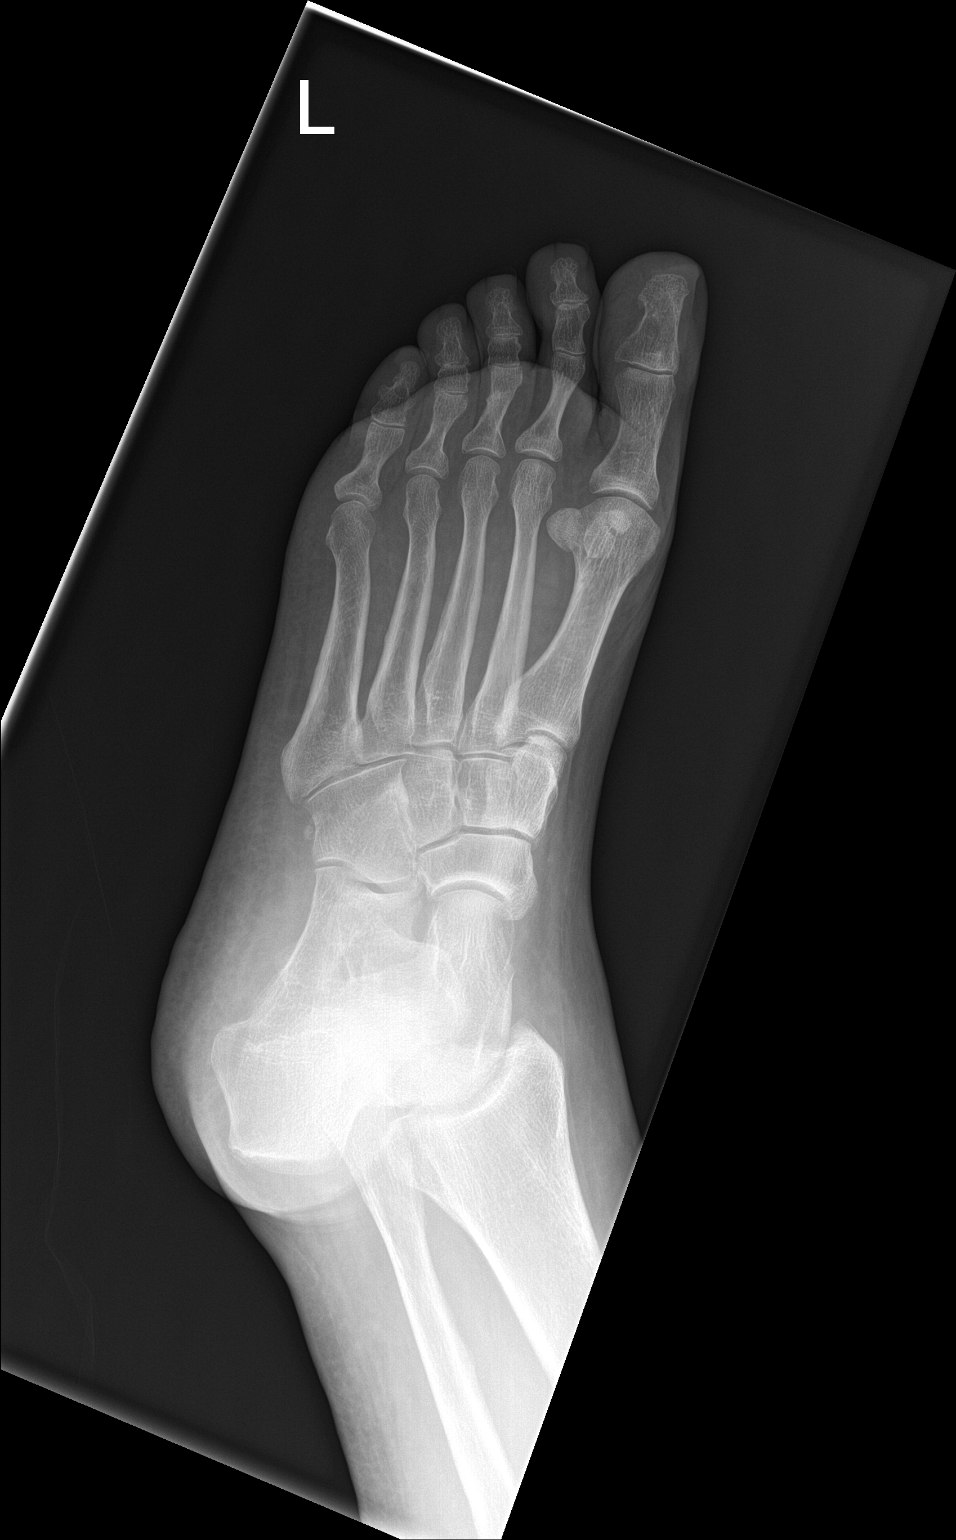

[foot obl]
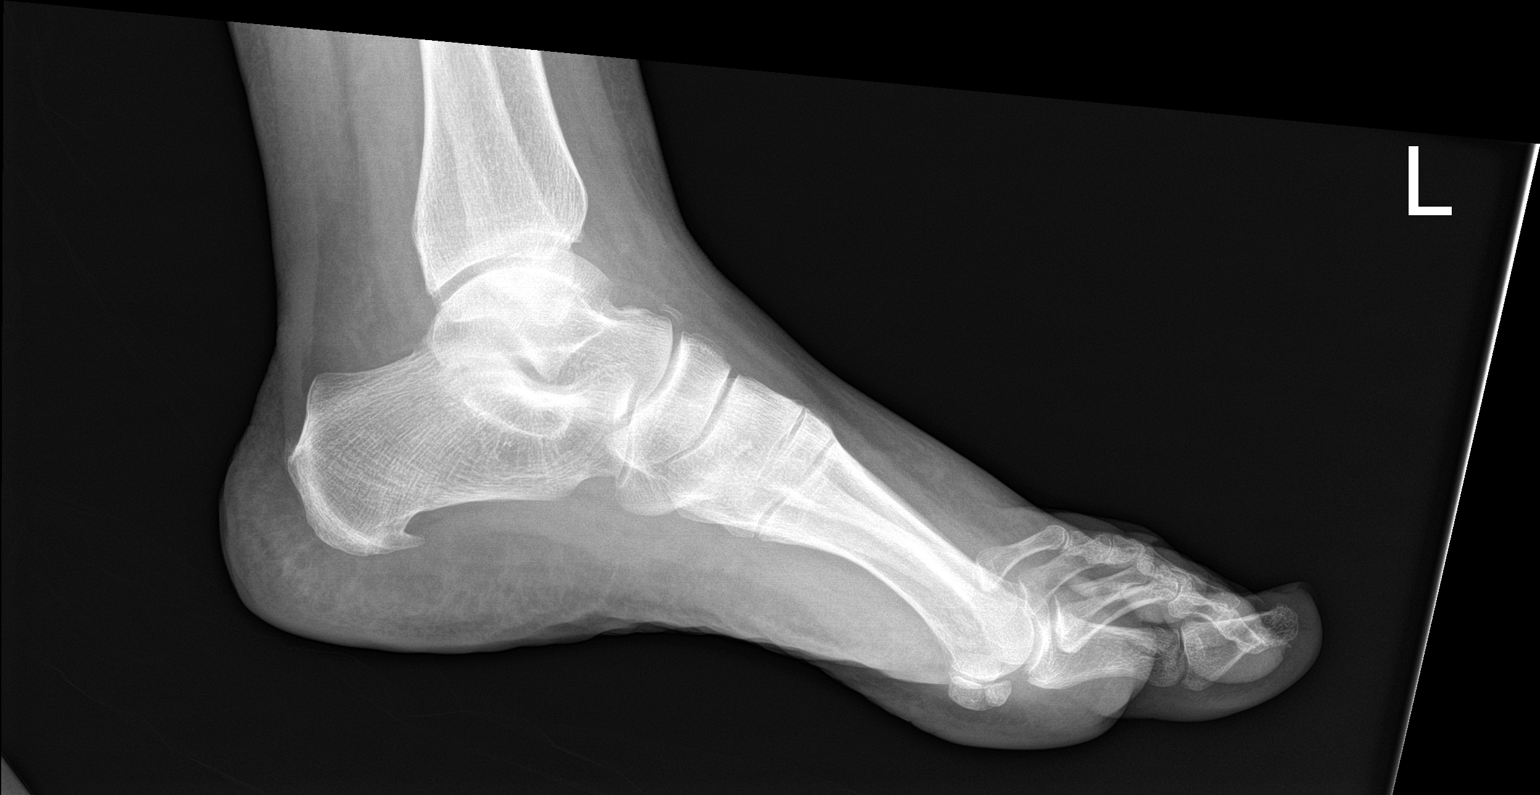

[3 of 3 positions shown; findings below may reference images not displayed]

FINDINGS: There is no acute fracture or dislocation of the foot. Fracture of
the lateral malleolus, better seen on the ankle radiograph. Mild
diffuse subcutaneous edema.
IMPRESSION: No acute fracture or dislocation of the foot.

## 2022-05-17 ENCOUNTER — Other Ambulatory Visit: Payer: Self-pay | Admitting: Family Medicine

## 2022-05-17 NOTE — Telephone Encounter (Signed)
Called pt - LMOM to return call and make appt.

## 2022-05-17 NOTE — Telephone Encounter (Signed)
Requested medications are due for refill today.  yes  Requested medications are on the active medications list.  yes  Last refill. 12/17/2021 #90 1 refill  Future visit scheduled.   no  Notes to clinic.  Pt is more than 3 months overdue for OV.    Requested Prescriptions  Pending Prescriptions Disp Refills   citalopram (CELEXA) 20 MG tablet [Pharmacy Med Name: CITALOPRAM HBR 20 MG TABLET] 90 tablet 1    Sig: TAKE 1 TABLET BY MOUTH EVERY DAY     Psychiatry:  Antidepressants - SSRI Failed - 05/17/2022  4:51 AM      Failed - Valid encounter within last 6 months    Recent Outpatient Visits           9 months ago Neuropathy   [facility] Donita Brooks, MD   10 months ago Depression, recurrent [facility])   [facility] Valentino Nose, NP   11 months ago Anxiety and depression   [facility] Valentino Nose, NP   4 years ago Hyperlipidemia, unspecified hyperlipidemia type   [facility] Allayne Butcher B, PA-C   5 years ago Major depressive disorder with single episode, in remission [facility])   [facility] Allayne Butcher B, PA-C               Passed - Completed PHQ-2 or PHQ-9 in the last 360 days

## 2022-06-28 ENCOUNTER — Emergency Department (HOSPITAL_BASED_OUTPATIENT_CLINIC_OR_DEPARTMENT_OTHER)
Admission: EM | Admit: 2022-06-28 | Discharge: 2022-06-28 | Disposition: A | Discharge status: Home / Self Care | Payer: 59 | Attending: Emergency Medicine | Admitting: Emergency Medicine

## 2022-06-28 ENCOUNTER — Other Ambulatory Visit: Payer: Self-pay

## 2022-06-28 DIAGNOSIS — H6122 Impacted cerumen, left ear: Secondary | ICD-10-CM | POA: Diagnosis not present

## 2022-06-28 DIAGNOSIS — H9202 Otalgia, left ear: Secondary | ICD-10-CM | POA: Diagnosis present

## 2022-06-28 DIAGNOSIS — I10 Essential (primary) hypertension: Secondary | ICD-10-CM | POA: Insufficient documentation

## 2022-06-28 DIAGNOSIS — E119 Type 2 diabetes mellitus without complications: Secondary | ICD-10-CM | POA: Insufficient documentation

## 2022-06-28 NOTE — ED Provider Notes (Signed)
[facility] EMERGENCY DEPARTMENT Provider Note   CSN: [id number] Arrival date & time: 06/28/22  1025     History  Chief Complaint  Patient presents with   Otalgia    Emily Franco is a 51 y.o. female hypertension, diabetes mellitus, obesity, hyperlipidemia, anxiety, depression, neuropathy.  Presents to the emergency department the chief complaint of left otalgia.  Patient states that otalgia has been present over the last 2 days.  Pain has been constant over this time.  Patient reports that she has used Q-tips to try to help with her pain with no improvement.  Patient reports decreased hearing to affected ear.  Denies any barotrauma or recent swimming.  Denies any fever, chills, otorrhea, facial swelling, neck pain, neck stiffness dental pain, sore throat, trismus, homicidal voice, trouble swallowing, trouble breathing.   Otalgia Associated symptoms: hearing loss   Associated symptoms: no congestion, no ear discharge, no neck pain, no rhinorrhea and no sore throat        Home Medications Prior to Admission medications   Medication Sig Start Date End Date Taking? Authorizing Provider  benzonatate (TESSALON) 100 MG capsule Take 1 capsule (100 mg total) by mouth 3 (three) times daily as needed for cough. 11/29/21   Placido Sou, PA-C  citalopram (CELEXA) 20 MG tablet TAKE 1 TABLET BY MOUTH EVERY DAY 05/19/22   Mechele Claude, MD  traZODone (DESYREL) 50 MG tablet TAKE 1/2-1 TABLETS BY MOUTH AT BEDTIME AS NEEDED FOR SLEEP. 12/27/21   Donita Brooks, MD      Allergies    Patient has no known allergies.    Review of Systems   Review of Systems  HENT:  Positive for ear pain and hearing loss. Negative for congestion, ear discharge, facial swelling, rhinorrhea, sore throat, trouble swallowing and voice change.   Respiratory:  Negative for shortness of breath.   Musculoskeletal:  Negative for neck pain and neck stiffness.    Physical Exam Updated Vital  Signs BP 139/90 (BP Location: Right Arm)   Pulse 91   Temp 98.5 F (36.9 C) (Oral)   Resp 20   Ht 5\' 2"  (1.575 m)   Wt 90.7 kg   LMP 04/01/2013   SpO2 98%   BMI 36.58 kg/m  Physical Exam Vitals and nursing note reviewed.  Constitutional:      General: She is not in acute distress.    Appearance: She is not ill-appearing, toxic-appearing or diaphoretic.  HENT:     Head: Normocephalic.     Right Ear: Tympanic membrane, ear canal and external ear normal. No mastoid tenderness.     Left Ear: External ear normal. No laceration, drainage, swelling or tenderness. There is impacted cerumen. No mastoid tenderness.     Ears:     Comments: No auricle proptosis bilaterally.  Cerumen impaction to left ear canal, unable to visualize left TM.    Mouth/Throat:     Lips: Pink. No lesions.     Mouth: Mucous membranes are moist. No angioedema.     Dentition: No dental tenderness.     Pharynx: Oropharynx is clear. Uvula midline. No pharyngeal swelling, oropharyngeal exudate, posterior oropharyngeal erythema or uvula swelling.     Tonsils: No tonsillar exudate or tonsillar abscesses. 1+ on the right. 1+ on the left.  Eyes:     General: No scleral icterus.       Right eye: No discharge.        Left eye: No discharge.  Neck:  Comments: No swelling to submandibular space Cardiovascular:     Rate and Rhythm: Normal rate.  Pulmonary:     Effort: Pulmonary effort is normal.  Musculoskeletal:     Cervical back: Normal range of motion and neck supple. No edema, erythema, signs of trauma, rigidity, torticollis or crepitus. No pain with movement, spinous process tenderness or muscular tenderness. Normal range of motion.  Skin:    General: Skin is warm and dry.  Neurological:     General: No focal deficit present.     Mental Status: She is alert.  Psychiatric:        Behavior: Behavior is cooperative.     ED Results / Procedures / Treatments   Labs (all labs ordered are listed, but only  abnormal results are displayed) Labs Reviewed - No data to display  EKG None  Radiology No results found.  Procedures .Ear Cerumen Removal  Date/Time: 06/28/2022 12:13 PM  Performed by: Haskel Schroeder, PA-C Authorized by: Haskel Schroeder, PA-C   Consent:    Consent obtained:  Verbal   Consent given by:  Patient   Risks discussed:  Bleeding, infection, pain, TM perforation, incomplete removal and dizziness   Alternatives discussed:  No treatment Universal protocol:    Procedure explained and questions answered to patient or proxy's satisfaction: yes     Relevant documents present and verified: yes     Required blood products, implants, devices, and special equipment available: yes     Immediately prior to procedure, a time out was called: yes     Patient identity confirmed:  Verbally with patient and arm band Procedure details:    Location:  L ear   Procedure type: irrigation     Procedure outcomes: cerumen removed   Post-procedure details:    Inspection:  Some cerumen remaining   Hearing quality:  Improved   Procedure completion:  Procedure terminated at patient's request .Ear Cerumen Removal  Date/Time: 06/28/2022 12:14 PM  Performed by: Haskel Schroeder, PA-C Authorized by: Haskel Schroeder, PA-C   Consent:    Consent obtained:  Verbal   Consent given by:  Patient   Risks, benefits, and alternatives were discussed: yes     Risks discussed:  Bleeding, dizziness, infection, incomplete removal, pain and TM perforation   Alternatives discussed:  No treatment Universal protocol:    Procedure explained and questions answered to patient or proxy's satisfaction: yes     Relevant documents present and verified: yes     Required blood products, implants, devices, and special equipment available: yes     Immediately prior to procedure, a time out was called: yes     Patient identity confirmed:  Arm band and verbally with patient Procedure details:     Location:  L ear   Procedure type: curette     Procedure outcomes: cerumen removed   Post-procedure details:    Inspection:  Some cerumen remaining and no bleeding   Hearing quality:  Improved   Procedure completion:  Procedure terminated at patient's request     Medications Ordered in ED Medications - No data to display  ED Course/ Medical Decision Making/ A&P                           Medical Decision Making  Alert 51 year old female in no acute distress, nontoxic-appearing.  Presents to the emergency department with a chief complaint of left ear pain.  Information was obtained from patient.  I reviewed patient's past medical records including previous bladder notes, labs, and imaging.  Patient has medical history as outlined in HPI which complicates her care.  On physical exam patient noted to have cerumen impaction to left ear.  Shared decision making with patient about cerumen removal via irrigation and curette in the emergency department.  Procedure was performed with improvement of patient's hearing and cerumen removal.  Patient elected to terminate procedure before entire cerumen impaction could be removed.  Patient will continue irrigation with Debrox at home.  Patient to follow-up with PCP as needed.  Based on patient's chief complaint, I considered admission might be necessary, however after reassuring ED workup feel patient is reasonable for discharge.  Discussed results, findings, treatment and follow up. Patient advised of return precautions. Patient verbalized understanding and agreed with plan.  Portions of this note were generated with Scientist, clinical (histocompatibility and immunogenetics). Dictation errors may occur despite best attempts at proofreading.         Final Clinical Impression(s) / ED Diagnoses Final diagnoses:  None    Rx / DC Orders ED Discharge Orders     None         Haskel Schroeder, PA-C 06/28/22 1216    Tanda Rockers A, DO 06/29/22 0818

## 2022-06-28 NOTE — Discharge Instructions (Signed)
You came to the emergency department today to be evaluated for your left ear pain.  You are found to have a cerumen impaction also known as a buildup of wax in your ear.  This is causing your pain and discomfort.  We attempted to remove the cerumen in the emergency department however were there is still more earwax that we could not remove.  Please perform irrigations at home using over-the-counter debrox.  Please follow-up with your primary care doctor as needed.  Get help right away if: You have bleeding from the affected ear. You have severe ear pain.

## 2022-06-28 NOTE — ED Notes (Signed)
Reviewed discharge instructions and recommendations with pt. States understanding.Pt ambulatory upon discharge

## 2022-06-28 NOTE — ED Triage Notes (Signed)
C/O ear pain on left side x 2 days;

## 2022-07-12 ENCOUNTER — Ambulatory Visit: Payer: 59 | Admitting: Family Medicine

## 2022-07-13 ENCOUNTER — Telehealth: Payer: Self-pay

## 2022-07-13 NOTE — Telephone Encounter (Signed)
Erroneous encounter. Please disregard.

## 2022-07-24 ENCOUNTER — Other Ambulatory Visit: Payer: Self-pay | Admitting: Family Medicine

## 2022-07-26 ENCOUNTER — Ambulatory Visit (INDEPENDENT_AMBULATORY_CARE_PROVIDER_SITE_OTHER): Payer: 59 | Admitting: Family Medicine

## 2022-07-26 VITALS — BP 122/76 | HR 76 | Ht 62.0 in | Wt 237.8 lb

## 2022-07-26 DIAGNOSIS — Z13220 Encounter for screening for lipoid disorders: Secondary | ICD-10-CM

## 2022-07-26 DIAGNOSIS — Z1211 Encounter for screening for malignant neoplasm of colon: Secondary | ICD-10-CM | POA: Diagnosis not present

## 2022-07-26 DIAGNOSIS — Z1231 Encounter for screening mammogram for malignant neoplasm of breast: Secondary | ICD-10-CM | POA: Diagnosis not present

## 2022-07-26 MED ORDER — CITALOPRAM HYDROBROMIDE 20 MG PO TABS: 20.0000 mg | ORAL_TABLET | Freq: Every day | ORAL | 3 refills | Status: DC

## 2022-07-26 NOTE — Progress Notes (Signed)
Subjective:    Patient ID: Emily Franco, female    DOB: [birth year], 51 y.o.   MRN: [id number]  HPI 08/05/21 Patient is a very pleasant 51 year old Caucasian female who presents today complaining of balance issues.  She states that gradually over the last 2 years she has noticed that her balance is started to deteriorate.  She states that she is fallen on several occasions.  She is actually fractured both of her feet due to falls and injuries.  She states that she does not feel steady on her feet and she feels unsteady walking down the hallway.  She feels like she needs to reach out to catch her self.  She does report some numbness and tingling in both feet.  She also reports weakness in both legs.  She denies any trouble swallowing or trouble breathing.  She denies any diplopia.  She denies any easy fatigability.  She denies any jerking or tremor.  She denies any slurred speech or facial droop or pill-rolling tremor.  On exam, the patient has a difficult time stepping up onto the exam table.  She has to push up with her hands as well as her legs.  She has normal reflexes checked at the biceps, triceps, and brachioradialis.  She has normal reflexes checked at the patella however her ankles are reflexes absent.  Muscle strength is 5/5 equal and symmetric in both legs to gross examination.  There is no obvious ataxia.  AT that time, my plan was:  I am concerned that the patient's falls and balance issues could be a combination of elevated BMI coupled with muscle weakness coupled with neuropathy.  I recommended checking a CBC CMP TSH A1c and vitamin B12 to evaluate for causes of neuropathy.  If lab work is normal, I will consult neurology for EMG/nerve conduction studies to evaluate for sensorimotor polyneuropathy.  07/26/22 Labs were normal and I recommended a neurology referral but I do not see where that ever occurred last year.  Patient has not been seen here since that visit.  Patient is here today  requesting a refill on her Celexa.  She states that without the Celexa she feels anxiety and depression but on the Celexa she sees significant benefit and would like to continue the medication.  She is overdue for mammogram.  She is overdue for colon cancer screening.  She is overdue for fasting lab work.  She is also overdue for Pap smear.  She would like me to schedule her for mammogram.  We discussed colonoscopy versus Cologuard. Past Medical History:  Diagnosis Date   Anxiety    history of not lately   Depression    Depression    Diabetes mellitus without complication (HCC)    gestational   Hyperlipidemia    Hypertension    Neuropathy    Obesity    Current Outpatient Medications on File Prior to Visit  Medication Sig Dispense Refill   benzonatate (TESSALON) 100 MG capsule Take 1 capsule (100 mg total) by mouth 3 (three) times daily as needed for cough. 14 capsule 0   citalopram (CELEXA) 20 MG tablet TAKE 1 TABLET BY MOUTH EVERY DAY 30 tablet 0   traZODone (DESYREL) 50 MG tablet TAKE 1/2-1 TABLETS BY MOUTH AT BEDTIME AS NEEDED FOR SLEEP. 90 tablet 1   No current facility-administered medications on file prior to visit.   No Known Allergies Social History   Socioeconomic History   Marital status: Divorced    Spouse name: Not  on file   Number of children: Not on file   Years of education: Not on file   Highest education level: Not on file  Occupational History   Not on file  Tobacco Use   Smoking status: Never   Smokeless tobacco: Never  Vaping Use   Vaping Use: Never used  Substance and Sexual Activity   Alcohol use: Yes    Alcohol/week: 4.0 standard drinks of alcohol    Types: 4 Glasses of wine per week    Comment: occasional   Drug use: Yes    Types: Marijuana   Sexual activity: Not on file  Other Topics Concern   Not on file  Social History Narrative   Not on file   Social Determinants of Health   Financial Resource Strain: Not on file  Food Insecurity: Not  on file  Transportation Needs: Not on file  Physical Activity: Not on file  Stress: Not on file  Social Connections: Not on file  Intimate Partner Violence: Not on file     Review of Systems  All other systems reviewed and are negative.      Objective:   Physical Exam Vitals reviewed.  Constitutional:      General: She is not in acute distress.    Appearance: Normal appearance. She is obese. She is not ill-appearing or toxic-appearing.  Cardiovascular:     Rate and Rhythm: Normal rate and regular rhythm.     Heart sounds: Normal heart sounds. No murmur heard.    No friction rub. No gallop.  Pulmonary:     Effort: Pulmonary effort is normal. No respiratory distress.     Breath sounds: Normal breath sounds. No stridor. No wheezing, rhonchi or rales.  Neurological:     General: No focal deficit present.     Mental Status: She is alert and oriented to person, place, and time. Mental status is at baseline.     Cranial Nerves: No cranial nerve deficit.     Sensory: No sensory deficit.     Motor: No weakness.     Coordination: Coordination abnormal.     Gait: Gait normal.     Deep Tendon Reflexes: Reflexes normal.  Psychiatric:        Mood and Affect: Mood normal.        Behavior: Behavior normal.        Thought Content: Thought content normal.        Judgment: Judgment normal.        Assessment & Plan:  Colon cancer screening - Plan: Cologuard  Encounter for screening mammogram for malignant neoplasm of breast - Plan: MM Digital Screening  Screening cholesterol level - Plan: CBC with Differential/Platelet, Lipid panel, COMPLETE METABOLIC PANEL WITH GFR I will schedule the patient screening for colon cancer.  I will schedule the patient for mammogram screening for breast cancer.  I will check a CBC, CMP, and a lipid panel to screen for cholesterol.  Recommended a Pap smear but the patient politely defers that at the present time.  She states that she would like to schedule  this at a later date.

## 2022-07-27 LAB — CBC WITH DIFFERENTIAL/PLATELET
Absolute Monocytes: 502 cells/uL (ref 200–950)
Basophils Absolute: 49 cells/uL (ref 0–200)
Basophils Relative: 0.9 %
Eosinophils Absolute: 221 cells/uL (ref 15–500)
Eosinophils Relative: 4.1 %
HCT: 42.9 % (ref 35.0–45.0)
Hemoglobin: 14.5 g/dL (ref 11.7–15.5)
Lymphs Abs: 1685 cells/uL (ref 850–3900)
MCH: 29.4 pg (ref 27.0–33.0)
MCHC: 33.8 g/dL (ref 32.0–36.0)
MCV: 86.8 fL (ref 80.0–100.0)
MPV: 9.2 fL (ref 7.5–12.5)
Monocytes Relative: 9.3 %
Neutro Abs: 2943 cells/uL (ref 1500–7800)
Neutrophils Relative %: 54.5 %
Platelets: 389 10*3/uL (ref 140–400)
RBC: 4.94 10*6/uL (ref 3.80–5.10)
RDW: 12.9 % (ref 11.0–15.0)
Total Lymphocyte: 31.2 %
WBC: 5.4 10*3/uL (ref 3.8–10.8)

## 2022-07-27 LAB — LIPID PANEL
Cholesterol: 257 mg/dL — ABNORMAL HIGH (ref <200)
HDL: 56 mg/dL (ref >=50)
LDL Cholesterol (Calc): 181 mg/dL (calc) — ABNORMAL HIGH
Non-HDL Cholesterol (Calc): 201 mg/dL (calc) — ABNORMAL HIGH (ref <130)
Total CHOL/HDL Ratio: 4.6 (calc) (ref <5.0)
Triglycerides: 88 mg/dL (ref <150)

## 2022-07-27 LAB — COMPLETE METABOLIC PANEL WITH GFR
AG Ratio: 1.4 (calc) (ref 1.0–2.5)
ALT: 18 U/L (ref 6–29)
AST: 12 U/L (ref 10–35)
Albumin: 4 g/dL (ref 3.6–5.1)
Alkaline phosphatase (APISO): 114 U/L (ref 37–153)
BUN: 8 mg/dL (ref 7–25)
CO2: 27 mmol/L (ref 20–32)
Calcium: 9.5 mg/dL (ref 8.6–10.4)
Chloride: 104 mmol/L (ref 98–110)
Creat: 0.76 mg/dL (ref 0.50–1.03)
Globulin: 2.8 g/dL (calc) (ref 1.9–3.7)
Glucose, Bld: 83 mg/dL (ref 65–99)
Potassium: 4.4 mmol/L (ref 3.5–5.3)
Sodium: 140 mmol/L (ref 135–146)
Total Bilirubin: 0.4 mg/dL (ref 0.2–1.2)
Total Protein: 6.8 g/dL (ref 6.1–8.1)
eGFR: 94 mL/min/{1.73_m2} (ref >=60)

## 2022-07-28 ENCOUNTER — Other Ambulatory Visit: Payer: Self-pay

## 2022-07-28 DIAGNOSIS — E785 Hyperlipidemia, unspecified: Secondary | ICD-10-CM

## 2022-07-28 MED ORDER — ROSUVASTATIN CALCIUM 10 MG PO TABS: 10.0000 mg | ORAL_TABLET | Freq: Every day | ORAL | 3 refills | Status: DC

## 2022-08-08 ENCOUNTER — Telehealth: Payer: Self-pay

## 2022-08-08 NOTE — Telephone Encounter (Signed)
Pt called stating that during office visit, you mentioned to pt about getting an [place]?  Please advice?

## 2022-08-09 ENCOUNTER — Other Ambulatory Visit: Payer: Self-pay | Admitting: Family Medicine

## 2022-08-09 DIAGNOSIS — R1011 Right upper quadrant pain: Secondary | ICD-10-CM

## 2022-08-09 NOTE — Telephone Encounter (Signed)
Pt returned call this afternoon. Told pt per Dr. Tanya Nones, will put in an order for a mammogram for breast cancer screening. Pt voiced understanding.  However, pt wanted stated that the [place] was for for r-stomach pain. Pt stated that last week she was not feeling good, just feels full, can't eat, feels like she couldn't breath just uncomfortable.   Pls advice

## 2022-08-09 NOTE — Telephone Encounter (Signed)
Tried call pt this morning. LVM for to return call.

## 2022-08-10 LAB — COLOGUARD

## 2022-08-10 NOTE — Telephone Encounter (Signed)
LVM per stating that per Dr. Tanya Nones, he has ordered her [place].   Told pt to call back if she has any questions or concerns.

## 2022-08-25 ENCOUNTER — Ambulatory Visit: Payer: 59

## 2022-09-29 ENCOUNTER — Ambulatory Visit: Payer: 59

## 2022-10-25 ENCOUNTER — Ambulatory Visit: Payer: Self-pay | Admitting: Family Medicine

## 2022-10-26 ENCOUNTER — Ambulatory Visit: Payer: Self-pay | Admitting: Family Medicine

## 2022-10-27 ENCOUNTER — Telehealth: Payer: Self-pay | Admitting: Family Medicine

## 2022-10-27 ENCOUNTER — Ambulatory Visit (INDEPENDENT_AMBULATORY_CARE_PROVIDER_SITE_OTHER): Payer: Commercial Managed Care - HMO | Admitting: Family Medicine

## 2022-10-27 VITALS — BP 120/62 | HR 69 | Ht 62.0 in | Wt 239.6 lb

## 2022-10-27 DIAGNOSIS — E7800 Pure hypercholesterolemia, unspecified: Secondary | ICD-10-CM | POA: Diagnosis not present

## 2022-10-27 LAB — COMPLETE METABOLIC PANEL WITH GFR
AG Ratio: 1.5 (calc) (ref 1.0–2.5)
ALT: 30 U/L — ABNORMAL HIGH (ref 6–29)
AST: 18 U/L (ref 10–35)
Albumin: 4.1 g/dL (ref 3.6–5.1)
Alkaline phosphatase (APISO): 97 U/L (ref 37–153)
BUN: 7 mg/dL (ref 7–25)
CO2: 29 mmol/L (ref 20–32)
Calcium: 9.3 mg/dL (ref 8.6–10.4)
Chloride: 102 mmol/L (ref 98–110)
Creat: 0.65 mg/dL (ref 0.50–1.03)
Globulin: 2.7 g/dL (calc) (ref 1.9–3.7)
Glucose, Bld: 84 mg/dL (ref 65–99)
Potassium: 4.1 mmol/L (ref 3.5–5.3)
Sodium: 138 mmol/L (ref 135–146)
Total Bilirubin: 0.4 mg/dL (ref 0.2–1.2)
Total Protein: 6.8 g/dL (ref 6.1–8.1)
eGFR: 105 mL/min/{1.73_m2} (ref >=60)

## 2022-10-27 LAB — LIPID PANEL
Cholesterol: 177 mg/dL (ref <200)
HDL: 59 mg/dL (ref >=50)
LDL Cholesterol (Calc): 104 mg/dL (calc) — ABNORMAL HIGH
Non-HDL Cholesterol (Calc): 118 mg/dL (calc) (ref <130)
Total CHOL/HDL Ratio: 3 (calc) (ref <5.0)
Triglycerides: 58 mg/dL (ref <150)

## 2022-10-27 MED ORDER — TRAZODONE HCL 100 MG PO TABS: 100.0000 mg | ORAL_TABLET | Freq: Every evening | ORAL | 3 refills | Status: DC | PRN

## 2022-10-27 NOTE — Progress Notes (Signed)
+                                                                                                                                                                                                                                                                                                                                                                    Subjective:    Patient ID: Emily Franco, female    DOB: 11-28-1971, 51 y.o.   MRN: [id number]  HPI Patient is currently on Crestor 10 mg daily for hyperlipidemia.  Please see the lab work from her physical exam earlier this year.  She has been taking Crestor without side effects.  She denies any myalgias or right upper quadrant pain Past Medical History:  Diagnosis Date   Anxiety    history of not lately   Depression    Depression    Diabetes mellitus without complication (HCC)    gestational   Hyperlipidemia    Hypertension    Neuropathy    Obesity    Current Outpatient Medications on File Prior to Visit  Medication Sig Dispense Refill   citalopram (CELEXA) 20 MG tablet Take 1 tablet (20 mg total) by mouth daily. 90 tablet 3   rosuvastatin (CRESTOR) 10 MG tablet Take 1 tablet (10 mg total) by mouth daily. 90 tablet 3   No current facility-administered medications on file prior to visit.   No Known Allergies Social History   Socioeconomic History   Marital status: Divorced    Spouse name: Not on file   Number of children: Not on file   Years of education: Not on file   Highest education level: Not on file  Occupational History   Not on file  Tobacco Use  Smoking  status: Never   Smokeless tobacco: Never  Vaping Use   Vaping Use: Never used  Substance and Sexual Activity   Alcohol use: Yes    Alcohol/week: 4.0 standard drinks of alcohol    Types: 4 Glasses of wine per week    Comment: occasional   Drug use: Yes    Types: Marijuana   Sexual activity: Not on file  Other Topics Concern   Not on file  Social History Narrative   Not on file   Social Determinants of Health   Financial Resource Strain: Not on file  Food Insecurity: Not on file  Transportation Needs: Not on file  Physical Activity: Not on file  Stress: Not on file  Social Connections: Not on file  Intimate Partner Violence: Not on file     Review of Systems  All other systems reviewed and are negative.      Objective:   Physical Exam Vitals reviewed.  Constitutional:      General: She is not in acute distress.    Appearance: Normal appearance. She is obese. She is not ill-appearing or toxic-appearing.  Cardiovascular:     Rate and Rhythm: Normal rate and regular rhythm.     Heart sounds: Normal heart sounds. No murmur heard.    No friction rub. No gallop.  Pulmonary:     Effort: Pulmonary effort is normal. No respiratory distress.     Breath sounds: Normal breath sounds. No stridor. No wheezing, rhonchi or rales.  Neurological:     General: No focal deficit present.     Mental Status: She is alert and oriented to person, place, and time. Mental status is at baseline.     Cranial Nerves: No cranial nerve deficit.     Sensory: No sensory deficit.     Motor: No weakness.     Coordination: Coordination abnormal.     Gait: Gait normal.     Deep Tendon Reflexes: Reflexes normal.  Psychiatric:        Mood and Affect: Mood normal.        Behavior: Behavior normal.        Thought Content: Thought content normal.        Judgment: Judgment normal.          Assessment & Plan:  Pure hypercholesterolemia - Plan: Lipid panel, COMPLETE METABOLIC PANEL WITH  GFR Check CMP and lipid panel.  Goal LDL cholesterol is less than 130.  Patient is tolerating the medication without difficulty.  She would like to increase trazodone to 100 mg daily at night to help her sleep which I believe is reasonable

## 2022-10-27 NOTE — Telephone Encounter (Signed)
As per patient, new insurance is [employer]; they only provide a digital copy of the insurance card.  ID # [id number] Group# [id number] Effective date: 09/11/2022  Provider's contact # [phone].

## 2022-11-10 ENCOUNTER — Ambulatory Visit (INDEPENDENT_AMBULATORY_CARE_PROVIDER_SITE_OTHER): Payer: Commercial Managed Care - HMO | Admitting: Family Medicine

## 2022-11-10 ENCOUNTER — Encounter: Payer: Self-pay | Admitting: Family Medicine

## 2022-11-10 VITALS — BP 150/98 | HR 79 | Temp 97.7°F | Ht 62.0 in | Wt 241.0 lb

## 2022-11-10 DIAGNOSIS — Z124 Encounter for screening for malignant neoplasm of cervix: Secondary | ICD-10-CM | POA: Diagnosis not present

## 2022-11-10 NOTE — Progress Notes (Signed)
Acute Office Visit  Subjective:     Patient ID: Emily Franco, female    DOB: [birth year], 51 y.o.   MRN: [id number]  Chief Complaint  Patient presents with   Follow-up    PAP EXAM     HPI Patient is in today for GYN exam. Last PAP was unknown (5-10 years ago) and she has never had an abnormal PAP. She is menopausal, her LMP was at age 59. G3P3. No concerns today. No family history of breast, ovarian, or cervical cancer. Mammogram scheduled for January. Declines STI testing.  Review of Systems  Reason unable to perform ROS: see HPI.  All other systems reviewed and are negative.       Objective:    BP (!) 150/98   Pulse 79   Temp 97.7 F (36.5 C) (Oral)   Ht 5\' 2"  (1.575 m)   Wt 241 lb (109.3 kg)   LMP 04/01/2013   SpO2 97%   BMI 44.08 kg/m     11/10/2022   11:10 AM 11/10/2022   11:01 AM 10/27/2022   10:23 AM  Vitals with BMI  Height  5\' 2"  5\' 2"   Weight  241 lbs 239 lbs 10 oz  BMI  44.07 43.81  Systolic 150 150 10/29/2022  Diastolic 98 102 62  Pulse  79 69     Physical Exam Vitals and nursing note reviewed. Exam conducted with a chaperone present.  Constitutional:      Appearance: Normal appearance. She is normal weight.  HENT:     Head: Normocephalic and atraumatic.  Chest:     Chest wall: No mass, deformity, swelling or tenderness.  Breasts:    Right: Normal.     Left: Normal.  Genitourinary:    General: Normal vulva.     Vagina: Normal.     Cervix: Normal.     Uterus: Normal.      Adnexa: Right adnexa normal and left adnexa normal.     Rectum: Normal.  Lymphadenopathy:     Upper Body:     Right upper body: No supraclavicular, axillary or pectoral adenopathy.     Left upper body: No supraclavicular, axillary or pectoral adenopathy.  Skin:    General: Skin is warm and dry.  Neurological:     General: No focal deficit present.     Mental Status: She is alert and oriented to person, place, and time. Mental status is at baseline.   Psychiatric:        Mood and Affect: Mood normal.        Behavior: Behavior normal.        Thought Content: Thought content normal.        Judgment: Judgment normal.     No results found for any visits on 11/10/22.      Assessment & Plan:   Problem List Items Addressed This Visit       Other   Cervical cancer screening - Primary    Routine PAP with bimanual exam and breast exam done today. No concerning findings on exam. Will repeat in 5 years or based on cytology results. Patient had no additional concerns today. Discussed screening recommendations for breast cancer and cervical cancer.      Relevant Orders   Pap, TP Imaging w/ CT/GC and w/ HPV RNA, rflx HPV Type 16/18    No orders of the defined types were placed in this encounter.   No follow-ups on file.  , FNP

## 2022-11-10 NOTE — Assessment & Plan Note (Signed)
Routine PAP with bimanual exam and breast exam done today. No concerning findings on exam. Will repeat in 5 years or based on cytology results. Patient had no additional concerns today. Discussed screening recommendations for breast cancer and cervical cancer.

## 2022-11-14 LAB — PAP, TP IMAGING W/ HPV RNA, RFLX HPV TYPE 16,18/45: HPV DNA High Risk: DETECTED — AB

## 2022-11-14 LAB — C. TRACHOMATIS/N. GONORRHOEAE RNA
C. trachomatis RNA, TMA: NOT DETECTED
N. gonorrhoeae RNA, TMA: NOT DETECTED

## 2022-11-14 LAB — PAP, TP IMAGING W/ CT/GC AND W/ HPV RNA, RFLX HPV TYPE 16/18

## 2022-11-16 ENCOUNTER — Telehealth: Payer: Self-pay | Admitting: Family Medicine

## 2022-11-16 DIAGNOSIS — R87618 Other abnormal cytological findings on specimens from cervix uteri: Secondary | ICD-10-CM

## 2022-11-16 NOTE — Telephone Encounter (Signed)
Abnormal PAP, referral to GYN for colposcopy

## 2022-11-22 ENCOUNTER — Encounter: Payer: Self-pay | Admitting: General Practice

## 2022-12-20 ENCOUNTER — Emergency Department (HOSPITAL_BASED_OUTPATIENT_CLINIC_OR_DEPARTMENT_OTHER)
Admission: EM | Admit: 2022-12-20 | Discharge: 2022-12-20 | Disposition: A | Discharge status: Home / Self Care | Payer: Commercial Managed Care - HMO | Attending: Emergency Medicine | Admitting: Emergency Medicine

## 2022-12-20 ENCOUNTER — Other Ambulatory Visit: Payer: Self-pay

## 2022-12-20 ENCOUNTER — Emergency Department (HOSPITAL_BASED_OUTPATIENT_CLINIC_OR_DEPARTMENT_OTHER): Payer: Commercial Managed Care - HMO

## 2022-12-20 ENCOUNTER — Encounter (HOSPITAL_BASED_OUTPATIENT_CLINIC_OR_DEPARTMENT_OTHER): Payer: Self-pay | Admitting: Urology

## 2022-12-20 DIAGNOSIS — E1140 Type 2 diabetes mellitus with diabetic neuropathy, unspecified: Secondary | ICD-10-CM | POA: Diagnosis not present

## 2022-12-20 DIAGNOSIS — Y990 Civilian activity done for income or pay: Secondary | ICD-10-CM | POA: Insufficient documentation

## 2022-12-20 DIAGNOSIS — S62619A Displaced fracture of proximal phalanx of unspecified finger, initial encounter for closed fracture: Secondary | ICD-10-CM | POA: Diagnosis not present

## 2022-12-20 DIAGNOSIS — I10 Essential (primary) hypertension: Secondary | ICD-10-CM | POA: Diagnosis not present

## 2022-12-20 DIAGNOSIS — W19XXXA Unspecified fall, initial encounter: Secondary | ICD-10-CM | POA: Insufficient documentation

## 2022-12-20 DIAGNOSIS — M79644 Pain in right finger(s): Secondary | ICD-10-CM | POA: Diagnosis present

## 2022-12-20 MED ORDER — HYDROCODONE-ACETAMINOPHEN 5-325 MG PO TABS
1.0000 | ORAL_TABLET | Freq: Four times a day (QID) | ORAL | 0 refills | Status: DC | PRN
Start: 2022-12-20 — End: 2023-01-10

## 2022-12-20 MED ORDER — OXYCODONE HCL 5 MG PO TABS
5.0000 mg | ORAL_TABLET | Freq: Once | ORAL | Status: AC
Start: 2022-12-20 — End: 2022-12-20
  Administered 2022-12-20: 5 mg via ORAL
  Filled 2022-12-20: qty 1

## 2022-12-20 MED ORDER — HYDROCODONE-ACETAMINOPHEN 5-325 MG PO TABS
1.0000 | ORAL_TABLET | Freq: Once | ORAL | Status: DC
  Filled 2022-12-20: qty 1

## 2022-12-20 NOTE — ED Triage Notes (Signed)
Right pointer finger injury at work today Swelling noted and bruising, states she fell onto it

## 2022-12-20 NOTE — ED Provider Notes (Signed)
[facility] Provider Note: Lowella Dell, MD, FACEP  CSN: [id number] MRN: [id number] ARRIVAL: 12/20/22 at 0038 ROOM: MH03/[facility]03   CHIEF COMPLAINT  Finger Injury   HISTORY OF PRESENT ILLNESS  12/20/22 3:22 AM Emily Franco is a 52 y.o. female who fell at work yesterday and landing on her right index finger.  The index finger was pointing at an abnormal angle which has partly been reduced.  There is ecchymosis and swelling associated with the finger and the pain is primarily located in the proximal phalanx.  She rates the pain as a 3 out of 10 at rest but worse if she attempts to move it.   Past Medical History:  Diagnosis Date   Anxiety    history of not lately   Depression    Depression    Diabetes mellitus without complication (HCC)    gestational   Hyperlipidemia    Hypertension    Neuropathy    Obesity     Past Surgical History:  Procedure Laterality Date   ORIF TOE FRACTURE Right 11/17/2014   Procedure: OPEN REDUCTION INTERNAL FIXATION (ORIF) RIGHT 5TH METATARSAL FRACTURE;  Surgeon: Cheral Almas, MD;  Location: [facility];  Service: Orthopedics;  Laterality: Right;   WISDOM TOOTH EXTRACTION     "laughing gas"    Family History  Problem Relation Age of Onset   Heart disease Father 20       CABG @ 41 y/o   Heart disease Brother 48       CAD @ 53 y/o    Social History   Tobacco Use   Smoking status: Never   Smokeless tobacco: Never  Vaping Use   Vaping Use: Never used  Substance Use Topics   Alcohol use: Yes    Alcohol/week: 4.0 standard drinks of alcohol    Types: 4 Glasses of wine per week    Comment: occasional   Drug use: Yes    Types: Marijuana    Comment: occ    Prior to Admission medications   Medication Sig Start Date End Date Taking? Authorizing Provider  HYDROcodone-acetaminophen (NORCO) 5-325 MG tablet Take 1 tablet by mouth every 6 (six) hours as needed for severe pain. 12/20/22  Yes Cricket Goodlin, MD  citalopram (CELEXA)  20 MG tablet Take 1 tablet (20 mg total) by mouth daily. 07/26/22   Donita Brooks, MD  rosuvastatin (CRESTOR) 10 MG tablet Take 1 tablet (10 mg total) by mouth daily. 07/28/22   Donita Brooks, MD  traZODone (DESYREL) 100 MG tablet Take 1 tablet (100 mg total) by mouth at bedtime as needed for sleep. 10/27/22   Donita Brooks, MD    Allergies Patient has no known allergies.   REVIEW OF SYSTEMS  Negative except as noted here or in the History of Present Illness.   PHYSICAL EXAMINATION  Initial Vital Signs Blood pressure (!) 148/103, pulse 83, temperature 99.7 F (37.6 C), resp. rate 20, height 5\' 2"  (1.575 m), weight 110.2 kg, last menstrual period 04/01/2013, SpO2 96 %.  Examination General: Well-developed, well-nourished female in no acute distress; appearance consistent with age of record HENT: normocephalic; atraumatic Eyes: Normal appearance Neck: supple Heart: regular rate and rhythm Lungs: clear to auscultation bilaterally Abdomen: soft; nondistended; nontender; bowel sounds present Extremities: Swelling, tenderness, ecchymosis and mild deformity of proximal phalanx of right index finger and hand proximal to the index finger:    Neurologic: Awake, alert and oriented; motor function intact in all extremities and  symmetric; no facial droop Skin: Warm and dry Psychiatric: Normal mood and affect   RESULTS  Summary of this visit's results, reviewed and interpreted by myself:   EKG Interpretation  Date/Time:    Ventricular Rate:    PR Interval:    QRS Duration:   QT Interval:    QTC Calculation:   R Axis:     Text Interpretation:         Laboratory Studies: No results found for this or any previous visit (from the past 24 hour(s)). Imaging Studies: DG Finger Index Right  Result Date: 12/20/2022 CLINICAL DATA:  Right pointer finger injury after fall EXAM: RIGHT INDEX FINGER 2+V COMPARISON:  None Available. FINDINGS: Acute comminuted mildly displaced  fracture of the base of the index finger proximal phalanx. The fracture line extends into the second MCP joint. Adjacent soft tissue swelling. IMPRESSION: Acute intra-articular fracture of the second finger proximal phalanx. Electronically Signed   By: Placido Sou M.D.   On: 12/20/2022 01:10    ED COURSE and MDM  Nursing notes, initial and subsequent vitals signs, including pulse oximetry, reviewed and interpreted by myself.  Vitals:   12/20/22 0046 12/20/22 0048  BP:  (!) 148/103  Pulse:  83  Resp:  20  Temp:  99.7 F (37.6 C)  SpO2:  96%  Weight: 110.2 kg   Height: 5\' 2"  (1.575 m)    Medications  HYDROcodone-acetaminophen (NORCO/VICODIN) 5-325 MG per tablet 1 tablet (has no administration in time range)    The fracture was reduced with traction as described below.  The patient was then placed in a finger splint.  We will refer to the hand surgeon on-call as this will likely require operative intervention.  PROCEDURES  .Ortho Injury Treatment  Date/Time: 12/20/2022 3:24 AM  Performed by: Darold Miley, MD Authorized by: Laurine Kuyper, MD   Consent:    Consent obtained:  Verbal   Consent given by:  Patient   Risks discussed:  FractureInjury location: finger Location details: right index finger Injury type: fracture-dislocation Fracture type: proximal phalanx MCP joint involved: yes IP joint involved: no Pre-procedure distal perfusion: normal Pre-procedure neurological function: normal Pre-procedure range of motion: reduced  Anesthesia: Local anesthesia used: no  Patient sedated: NoManipulation performed: yes Skeletal traction used: yes Reduction successful: yes Immobilization: splint Splint type: static finger Splint Applied by: ED Tech Supplies used: aluminum splint Post-procedure distal perfusion: normal Post-procedure neurological function: normal Post-procedure range of motion: unchanged    ED DIAGNOSES     ICD-10-CM   1. Closed fracture of proximal  phalanx of digit of right hand, initial encounter  S62.619A          Rieley Khalsa, Jenny Reichmann, MD 12/20/22 [phone]

## 2022-12-21 ENCOUNTER — Ambulatory Visit: Payer: 59

## 2022-12-26 ENCOUNTER — Telehealth: Payer: Self-pay | Admitting: Family Medicine

## 2022-12-26 NOTE — Telephone Encounter (Signed)
Prescription Request  12/26/2022  Is this a "Controlled Substance" medicine? No  LOV: 10/27/2022  What is the name of the medication or equipment?   traZODone (DESYREL) 100 MG tablet   Have you contacted your pharmacy to request a refill? Yes   Which pharmacy would you like this sent to?  [id number] - [place], [place] - [place] [address] [place] [address] Phone: [phone] Fax: [phone]    Patient notified that their request is being sent to the clinical staff for review and that they should receive a response within 2 business days.   Please advise pharmacist.

## 2022-12-27 ENCOUNTER — Other Ambulatory Visit: Payer: Self-pay | Admitting: Family Medicine

## 2022-12-27 MED ORDER — TRAZODONE HCL 100 MG PO TABS: 100.0000 mg | ORAL_TABLET | Freq: Every evening | ORAL | 3 refills | Status: DC | PRN

## 2023-01-05 ENCOUNTER — Encounter (HOSPITAL_BASED_OUTPATIENT_CLINIC_OR_DEPARTMENT_OTHER): Payer: Self-pay | Admitting: Orthopedic Surgery

## 2023-01-05 ENCOUNTER — Other Ambulatory Visit: Payer: Self-pay

## 2023-01-06 ENCOUNTER — Other Ambulatory Visit: Payer: Self-pay

## 2023-01-08 NOTE — H&P (Signed)
Preoperative History & Physical Exam  Surgeon: Philipp Ovens, MD  Diagnosis: Right index finger proximal phalanx fracture  Planned Procedure: Procedure(s) (LRB): Right index finger proximal phalanx closed reduction percutaneus pinning versus open reduction internal fixation (Right)  History of Present Illness:   Patient is a 52 y.o. female with symptoms consistent with Right index finger proximal phalanx fracture who presents for surgical intervention. The risks, benefits and alternatives of surgical intervention were discussed and informed consent was obtained prior to surgery.  Past Medical History:  Past Medical History:  Diagnosis Date   Anxiety    history of not lately   Depression    Depression    Hyperlipidemia    Hypertension    Neuropathy    Obesity     Past Surgical History:  Past Surgical History:  Procedure Laterality Date   ORIF TOE FRACTURE Right 11/17/2014   Procedure: OPEN REDUCTION INTERNAL FIXATION (ORIF) RIGHT 5TH METATARSAL FRACTURE;  Surgeon: Cheral Almas, MD;  Location: [facility];  Service: Orthopedics;  Laterality: Right;   WISDOM TOOTH EXTRACTION     "laughing gas"    Medications:  Prior to Admission medications   Medication Sig Start Date End Date Taking? Authorizing Provider  citalopram (CELEXA) 20 MG tablet Take 1 tablet (20 mg total) by mouth daily. 07/26/22  Yes Donita Brooks, MD  HYDROcodone-acetaminophen (NORCO) 5-325 MG tablet Take 1 tablet by mouth every 6 (six) hours as needed for severe pain. 12/20/22  Yes Molpus, John, MD  rosuvastatin (CRESTOR) 10 MG tablet Take 1 tablet (10 mg total) by mouth daily. 07/28/22  Yes Donita Brooks, MD  traZODone (DESYREL) 100 MG tablet Take 1 tablet (100 mg total) by mouth at bedtime as needed for sleep. 12/27/22  Yes Donita Brooks, MD    Allergies:  Patient has no known allergies.  Review of Systems: Negative except per HPI.  Physical Exam: Alert and oriented, NAD Head and neck: no  masses, normal alignment CV: pulse intact Pulm: no increased work of breathing, respirations even and unlabored Abdomen: non-distended Extremities: extremities warm and well perfused  LABS: Recent Results (from the past 2160 hour(s))  Lipid panel     Status: Abnormal   Collection Time: 10/27/22 10:34 AM  Result Value Ref Range   Cholesterol 177 <200 mg/dL   HDL 59 > OR = 50 mg/dL   Triglycerides 58 <259 mg/dL   LDL Cholesterol (Calc) 104 (H) mg/dL (calc)    Comment: Reference range: <100 . Desirable range <100 mg/dL for primary prevention;   <70 mg/dL for patients with CHD or diabetic patients  with > or = 2 CHD risk factors. Marland Kitchen LDL-C is now calculated using the Martin-Hopkins  calculation, which is a validated novel method providing  better accuracy than the Friedewald equation in the  estimation of LDL-C.  Horald Pollen et al. Lenox Ahr. 5638;756(43): 2061-2068  (http://education.[facility].com/faq/FAQ164)    Total CHOL/HDL Ratio 3.0 <5.0 (calc)   Non-HDL Cholesterol (Calc) 118 <130 mg/dL (calc)    Comment: For patients with diabetes plus 1 major ASCVD risk  factor, treating to a non-HDL-C goal of <100 mg/dL  (LDL-C of <32 mg/dL) is considered a therapeutic  option.   COMPLETE METABOLIC PANEL WITH GFR     Status: Abnormal   Collection Time: 10/27/22 10:34 AM  Result Value Ref Range   Glucose, Bld 84 65 - 99 mg/dL    Comment: .            Fasting reference  interval .    BUN 7 7 - 25 mg/dL   Creat 0.65 0.50 - 1.03 mg/dL   eGFR 105 > OR = 60 mL/min/1.51m2   BUN/Creatinine Ratio SEE NOTE: 6 - 22 (calc)    Comment:    Not Reported: BUN and Creatinine are within    reference range. .    Sodium 138 135 - 146 mmol/L   Potassium 4.1 3.5 - 5.3 mmol/L   Chloride 102 98 - 110 mmol/L   CO2 29 20 - 32 mmol/L   Calcium 9.3 8.6 - 10.4 mg/dL   Total Protein 6.8 6.1 - 8.1 g/dL   Albumin 4.1 3.6 - 5.1 g/dL   Globulin 2.7 1.9 - 3.7 g/dL (calc)   AG Ratio 1.5 1.0 - 2.5 (calc)    Total Bilirubin 0.4 0.2 - 1.2 mg/dL   Alkaline phosphatase (APISO) 97 37 - 153 U/L   AST 18 10 - 35 U/L   ALT 30 (H) 6 - 29 U/L  Pap, TP Imaging w/ CT/GC and w/ HPV RNA, rflx HPV Type 16/18     Status: None   Collection Time: 11/10/22 12:11 PM  Result Value Ref Range   Comment      Comment: This order for age-based cervical cancer and STI  screening follows ACOG guidelines(PB 168, 140, FAQ071). . See individual assays for performing site location.   PAP,TP IMGw/HPV RNA,rflx [id number],88/28     Status: Abnormal   Collection Time: 11/10/22 12:11 PM  Result Value Ref Range   Clinical Information:      Comment: None given   LMP:      Comment: NONE GIVEN   PREV. PAP:      Comment: NONE GIVEN   PREV. BX:      Comment: NONE GIVEN   HPV DNA Probe-Source      Comment: None given   STATEMENT OF ADEQUACY:      Comment: Satisfactory for evaluation. Endocervical/transformation zone component present.    GENERAL CATEGORIZATION: (A)     Comment: Cytology Results: Epithelial Cell Abnormality   INTERPRETATION/RESULT: (A)     Comment: Atypical Squamous Cells of Undetermined Significance (ASC-US)    Comment:      Comment: This Pap test has been evaluated with computer assisted technology. Suggest clinical correlation and follow-up as clinically appropriate    CYTOTECHNOLOGIST:      Comment: JAW, CT(ASCP) CT Screening Location: [place], [place], [place] [address] [id number]    PATHOLOGIST:      Comment: Sukru S. Demirci, MD, Board Certification in Anatomic/Clinical Pathology and Cytopathology Electronically Signed    HPV DNA High Risk Detected (A) Not Detected    Comment: Methodology: Transcription-Mediated Amplification . This assay detects E6/E7 viral messenger RNA (mRNA) from 14 high-risk HPV types (16,18,31,33,35,39,45,51,52,56,58,59,66,68). . . Cervical sources are required for HPV testing. If a vaginal source from a patient who has had a total hysterectomy  with removal of cervix was  submitted, please contact the testing laboratory for alternative testing options. . For additional information, please refer to http://education.[facility].com/faq/FAQ129v1 (This link if provided for information/ educational purposes only.) EXPLANATORY NOTE:  . The Pap is a screening test for cervical cancer. It is  not a diagnostic test and is subject to false negative  and false positive results. It is most reliable when a  satisfactory sample, regularly obtained, is submitted  with relevant clinical findings and history, and when  the Pap result is evaluated along with historic and  current clinical information. Marland Kitchen  C. trachomatis/N. gonorrhoeae RNA     Status: None   Collection Time: 11/10/22 12:11 PM  Result Value Ref Range   C. trachomatis RNA, TMA NOT DETECTED NOT DETECTED   N. gonorrhoeae RNA, TMA NOT DETECTED NOT DETECTED    Comment: The analytical performance characteristics of this assay, when used to test SurePath(TM) specimens have been determined by [employer]. The modifications have not been cleared or approved by the FDA. This assay has been validated pursuant to the CLIA regulations and is used for clinical purposes. . For additional information, please refer to https://education.[facility].com/faq/FAQ154 (This link is being provided for information/ educational purposes only.) .      Complete History and Physical exam available in the office notes  Orene Desanctis

## 2023-01-10 ENCOUNTER — Other Ambulatory Visit: Payer: Self-pay

## 2023-01-10 ENCOUNTER — Ambulatory Visit (HOSPITAL_BASED_OUTPATIENT_CLINIC_OR_DEPARTMENT_OTHER): Payer: No Typology Code available for payment source

## 2023-01-10 ENCOUNTER — Ambulatory Visit (HOSPITAL_BASED_OUTPATIENT_CLINIC_OR_DEPARTMENT_OTHER)
Admission: RE | Admit: 2023-01-10 | Discharge: 2023-01-10 | Disposition: A | Discharge status: Home / Self Care | Payer: No Typology Code available for payment source | Attending: Orthopedic Surgery | Admitting: Orthopedic Surgery

## 2023-01-10 ENCOUNTER — Encounter (HOSPITAL_BASED_OUTPATIENT_CLINIC_OR_DEPARTMENT_OTHER): Payer: Self-pay | Admitting: Orthopedic Surgery

## 2023-01-10 ENCOUNTER — Ambulatory Visit (HOSPITAL_BASED_OUTPATIENT_CLINIC_OR_DEPARTMENT_OTHER): Payer: No Typology Code available for payment source | Admitting: Certified Registered"

## 2023-01-10 ENCOUNTER — Encounter (HOSPITAL_BASED_OUTPATIENT_CLINIC_OR_DEPARTMENT_OTHER)
Admission: RE | Disposition: A | Discharge status: Home / Self Care | Payer: Self-pay | Source: Home / Self Care | Attending: Orthopedic Surgery

## 2023-01-10 DIAGNOSIS — X58XXXA Exposure to other specified factors, initial encounter: Secondary | ICD-10-CM | POA: Diagnosis not present

## 2023-01-10 DIAGNOSIS — Z79899 Other long term (current) drug therapy: Secondary | ICD-10-CM | POA: Diagnosis not present

## 2023-01-10 DIAGNOSIS — F32A Depression, unspecified: Secondary | ICD-10-CM | POA: Diagnosis not present

## 2023-01-10 DIAGNOSIS — S62610A Displaced fracture of proximal phalanx of right index finger, initial encounter for closed fracture: Secondary | ICD-10-CM | POA: Insufficient documentation

## 2023-01-10 DIAGNOSIS — I10 Essential (primary) hypertension: Secondary | ICD-10-CM | POA: Insufficient documentation

## 2023-01-10 DIAGNOSIS — F419 Anxiety disorder, unspecified: Secondary | ICD-10-CM | POA: Diagnosis not present

## 2023-01-10 DIAGNOSIS — E785 Hyperlipidemia, unspecified: Secondary | ICD-10-CM | POA: Insufficient documentation

## 2023-01-10 DIAGNOSIS — Z01818 Encounter for other preprocedural examination: Secondary | ICD-10-CM

## 2023-01-10 DIAGNOSIS — Z6841 Body Mass Index (BMI) 40.0 and over, adult: Secondary | ICD-10-CM | POA: Insufficient documentation

## 2023-01-10 HISTORY — PX: OPEN REDUCTION INTERNAL FIXATION (ORIF) PROXIMAL PHALANX: SHX6235

## 2023-01-10 SURGERY — OPEN REDUCTION INTERNAL FIXATION (ORIF) PROXIMAL PHALANX
Anesthesia: Monitor Anesthesia Care | Site: Finger | Laterality: Right

## 2023-01-10 MED ORDER — ROPIVACAINE HCL 5 MG/ML IJ SOLN
INTRAMUSCULAR | Status: DC | PRN
  Administered 2023-01-10: 30 mL via PERINEURAL

## 2023-01-10 MED ORDER — FENTANYL CITRATE (PF) 100 MCG/2ML IJ SOLN
100.0000 ug | Freq: Once | INTRAMUSCULAR | Status: AC
  Administered 2023-01-10: 100 ug via INTRAVENOUS

## 2023-01-10 MED ORDER — OXYCODONE HCL 5 MG PO TABS: 5.0000 mg | ORAL_TABLET | Freq: Once | ORAL | Status: DC | PRN

## 2023-01-10 MED ORDER — MIDAZOLAM HCL 2 MG/2ML IJ SOLN
2.0000 mg | Freq: Once | INTRAMUSCULAR | Status: AC
  Administered 2023-01-10: 2 mg via INTRAVENOUS

## 2023-01-10 MED ORDER — CEFAZOLIN SODIUM-DEXTROSE 2-4 GM/100ML-% IV SOLN
2.0000 g | INTRAVENOUS | Status: AC
  Administered 2023-01-10: 2 g via INTRAVENOUS

## 2023-01-10 MED ORDER — MIDAZOLAM HCL 2 MG/2ML IJ SOLN
INTRAMUSCULAR | Status: AC
  Filled 2023-01-10: qty 2

## 2023-01-10 MED ORDER — HYDROMORPHONE HCL 1 MG/ML IJ SOLN: 0.2500 mg | INTRAMUSCULAR | Status: DC | PRN

## 2023-01-10 MED ORDER — LIDOCAINE HCL (CARDIAC) PF 100 MG/5ML IV SOSY
PREFILLED_SYRINGE | INTRAVENOUS | Status: DC | PRN
  Administered 2023-01-10: 30 mg via INTRAVENOUS

## 2023-01-10 MED ORDER — CEFAZOLIN SODIUM-DEXTROSE 2-4 GM/100ML-% IV SOLN
INTRAVENOUS | Status: AC
  Filled 2023-01-10: qty 100

## 2023-01-10 MED ORDER — BACITRACIN ZINC 500 UNIT/GM EX OINT
TOPICAL_OINTMENT | CUTANEOUS | Status: DC | PRN
  Administered 2023-01-10: 1 via TOPICAL

## 2023-01-10 MED ORDER — PROMETHAZINE HCL 25 MG/ML IJ SOLN: 6.2500 mg | INTRAMUSCULAR | Status: DC | PRN

## 2023-01-10 MED ORDER — DEXMEDETOMIDINE HCL IN NACL 80 MCG/20ML IV SOLN
INTRAVENOUS | Status: DC | PRN
  Administered 2023-01-10: 12 ug via BUCCAL

## 2023-01-10 MED ORDER — FENTANYL CITRATE (PF) 100 MCG/2ML IJ SOLN
INTRAMUSCULAR | Status: AC
  Filled 2023-01-10: qty 2

## 2023-01-10 MED ORDER — OXYCODONE HCL 5 MG/5ML PO SOLN: 5.0000 mg | Freq: Once | ORAL | Status: DC | PRN

## 2023-01-10 MED ORDER — HYDROCODONE-ACETAMINOPHEN 5-325 MG PO TABS: 1.0000 | ORAL_TABLET | Freq: Four times a day (QID) | ORAL | 0 refills | Status: AC | PRN

## 2023-01-10 MED ORDER — ONDANSETRON HCL 4 MG/2ML IJ SOLN
INTRAMUSCULAR | Status: DC | PRN
  Administered 2023-01-10: 4 mg via INTRAVENOUS

## 2023-01-10 MED ORDER — PROPOFOL 500 MG/50ML IV EMUL
INTRAVENOUS | Status: DC | PRN
  Administered 2023-01-10: 100 ug/kg/min via INTRAVENOUS

## 2023-01-10 MED ORDER — LACTATED RINGERS IV SOLN: INTRAVENOUS | Status: DC

## 2023-01-10 SURGICAL SUPPLY — 31 items
BLADE SURG 15 STRL LF DISP TIS (BLADE) ×1 IMPLANT
BLADE SURG 15 STRL SS (BLADE) ×1
BNDG CMPR 9X4 STRL LF SNTH (GAUZE/BANDAGES/DRESSINGS) ×1
BNDG ELASTIC 4X5.8 VLCR STR LF (GAUZE/BANDAGES/DRESSINGS) ×1 IMPLANT
BNDG ESMARK 4X9 LF (GAUZE/BANDAGES/DRESSINGS) ×1 IMPLANT
COVER BACK TABLE 60X90IN (DRAPES) ×1 IMPLANT
CUFF TOURN SGL QUICK 18X4 (TOURNIQUET CUFF) ×1 IMPLANT
DRAPE EXTREMITY T 121X128X90 (DISPOSABLE) ×1 IMPLANT
DRAPE OEC MINIVIEW 54X84 (DRAPES) ×1 IMPLANT
DRAPE SURG 17X23 STRL (DRAPES) ×1 IMPLANT
DRSG EMULSION OIL 3X3 NADH (GAUZE/BANDAGES/DRESSINGS) ×1 IMPLANT
GAUZE SPONGE 4X4 12PLY STRL (GAUZE/BANDAGES/DRESSINGS) ×1 IMPLANT
GLOVE BIO SURGEON STRL SZ7.5 (GLOVE) ×1 IMPLANT
GLOVE BIOGEL PI IND STRL 7.5 (GLOVE) ×1 IMPLANT
GOWN STRL REUS W/ TWL LRG LVL3 (GOWN DISPOSABLE) ×1 IMPLANT
GOWN STRL REUS W/TWL LRG LVL3 (GOWN DISPOSABLE) ×1
GOWN STRL REUS W/TWL XL LVL3 (GOWN DISPOSABLE) ×1 IMPLANT
K-WIRE DBL .045X4 NSTRL (WIRE) ×1
KWIRE DBL .045X4 NSTRL (WIRE) IMPLANT
NEEDLE HYPO 22GX1.5 SAFETY (NEEDLE) ×1 IMPLANT
NS IRRIG 1000ML POUR BTL (IV SOLUTION) ×1 IMPLANT
PACK BASIN DAY SURGERY FS (CUSTOM PROCEDURE TRAY) ×1 IMPLANT
PADDING CAST ABS COTTON 4X4 ST (CAST SUPPLIES) ×1 IMPLANT
SHEET MEDIUM DRAPE 40X70 STRL (DRAPES) IMPLANT
SLING ARM FOAM STRAP LRG (SOFTGOODS) IMPLANT
SUT ETHILON 4 0 PS 2 18 (SUTURE) ×1 IMPLANT
SYR 10ML LL (SYRINGE) ×1 IMPLANT
SYR BULB EAR ULCER 3OZ GRN STR (SYRINGE) ×1 IMPLANT
TOWEL GREEN STERILE FF (TOWEL DISPOSABLE) ×2 IMPLANT
TRAY DSU PREP LF (CUSTOM PROCEDURE TRAY) ×1 IMPLANT
UNDERPAD 30X36 HEAVY ABSORB (UNDERPADS AND DIAPERS) ×1 IMPLANT

## 2023-01-10 NOTE — Anesthesia Procedure Notes (Signed)
Anesthesia Regional Block: Supraclavicular block   Pre-Anesthetic Checklist: , timeout performed,  Correct Patient, Correct Site, Correct Laterality,  Correct Procedure, Correct Position, site marked,  Risks and benefits discussed,  Surgical consent,  Pre-op evaluation,  At surgeon's request and post-op pain management  Laterality: Right  Prep: chloraprep       Needles:  Injection technique: Single-shot  Needle Type: Stimiplex     Needle Length: 9cm  Needle Gauge: 21     Additional Needles:   Procedures:,,,, ultrasound used (permanent image in chart),,    Narrative:  Start time: 01/10/2023 9:53 AM End time: 01/10/2023 9:58 AM  Performed by: Personally  Anesthesiologist: Lynda Rainwater, MD

## 2023-01-10 NOTE — Anesthesia Postprocedure Evaluation (Signed)
Anesthesia Post Note  Patient: Emily Franco  Procedure(s) Performed: Right index finger proximal phalanx closed reduction percutaneus pinning versus open reduction internal fixation (Right: Finger)     Patient location during evaluation: PACU Anesthesia Type: Regional and MAC Level of consciousness: awake and alert Pain management: pain level controlled Vital Signs Assessment: post-procedure vital signs reviewed and stable Respiratory status: spontaneous breathing, nonlabored ventilation and respiratory function stable Cardiovascular status: blood pressure returned to baseline and stable Postop Assessment: no apparent nausea or vomiting Anesthetic complications: no   No notable events documented.  Last Vitals:  Vitals:   01/10/23 1200 01/10/23 1245  BP: 110/76 (!) 143/89  Pulse: 66 76  Resp: 20 16  Temp:  (!) 36.4 C  SpO2: 99% 94%    Last Pain:  Vitals:   01/10/23 1245  TempSrc:   PainSc: 0-No pain                 Lynda Rainwater

## 2023-01-10 NOTE — Progress Notes (Signed)
CHG scrub performed in Pre-op per Dr Greta Doom request. Pt tolerated well.

## 2023-01-10 NOTE — Transfer of Care (Signed)
Immediate Anesthesia Transfer of Care Note  Patient: Emily Franco  Procedure(s) Performed: Right index finger proximal phalanx closed reduction percutaneus pinning versus open reduction internal fixation (Right: Finger)  Patient Location: PACU  Anesthesia Type:MAC combined with regional for post-op pain  Level of Consciousness: awake, alert , oriented, and patient cooperative  Airway & Oxygen Therapy: Patient Spontanous Breathing and Patient connected to face mask oxygen  Post-op Assessment: Report given to RN and Post -op Vital signs reviewed and stable  Post vital signs: Reviewed and stable  Last Vitals:  Vitals Value Taken Time  BP    Temp    Pulse    Resp 17 01/10/23 1146  SpO2    Vitals shown include unvalidated device data.  Last Pain:  Vitals:   01/10/23 0920  TempSrc: Oral  PainSc: 0-No pain      Patients Stated Pain Goal: 3 (43/15/40 0867)  Complications: No notable events documented.

## 2023-01-10 NOTE — Progress Notes (Signed)
Assisted Dr. Sabra Heck with right, supraclavicular, ultrasound guided block. Side rails up, monitors on throughout procedure. See vital signs in flow sheet. Tolerated Procedure well.

## 2023-01-10 NOTE — Anesthesia Procedure Notes (Signed)
Procedure Name: MAC Date/Time: 01/10/2023 11:29 AM  Performed by: Signe Colt, CRNAPre-anesthesia Checklist: Patient identified, Emergency Drugs available, Suction available, Patient being monitored and Timeout performed Patient Re-evaluated:Patient Re-evaluated prior to induction Oxygen Delivery Method: Simple face mask

## 2023-01-10 NOTE — Interval H&P Note (Signed)
History and Physical Interval Note:  01/10/2023 10:43 AM  Emily Franco  has presented today for surgery, with the diagnosis of Right index finger proximal phalanx fracture.  The various methods of treatment have been discussed with the patient and family. After consideration of risks, benefits and other options for treatment, the patient has consented to  Procedure(s): Right index finger proximal phalanx closed reduction percutaneus pinning versus open reduction internal fixation (Right) as a surgical intervention.  The patient's history has been reviewed, patient examined, no change in status, stable for surgery.  I have reviewed the patient's chart and labs.  Questions were answered to the patient's satisfaction.     Orene Desanctis

## 2023-01-10 NOTE — Op Note (Signed)
OPERATIVE NOTE  DATE OF PROCEDURE: 01/10/2023  SURGEONS:  Primary: Orene Desanctis, MD  PREOPERATIVE DIAGNOSIS: Right index finger proximal phalanx fracture  POSTOPERATIVE DIAGNOSIS: Same  NAME OF PROCEDURE:   Right index finger proximal phalanx CRPP Four view radiographs of right index finger with intraoperative interpretation  ANESTHESIA: Monitor Anesthesia Care + Block  SKIN PREPARATION: Hibiclens  ESTIMATED BLOOD LOSS: Minimal  IMPLANTS: 0.045 in k wire x 1  INDICATIONS:  Emily Franco is a 52 y.o. female who has the above preoperative diagnosis. The patient has decided to proceed with surgical intervention.  Risks, benefits and alternatives of operative management were discussed including, but not limited to, risks of anesthesia complications, infection, pain, persistent symptoms, stiffness, need for future surgery.  The patient understands, agrees and elects to proceed with surgery.    DESCRIPTION OF PROCEDURE: The patient was met in the pre-operative area and their identity was verified.  The operative location and laterality was also verified and marked.  The patient was brought to the OR and was placed supine on the table.  After repeat patient identification with the operative team anesthesia was provided and the patient was prepped and draped in the usual sterile fashion.  A final timeout was performed verifying the correction patient, procedure, location and laterality.  The right upper extremity was elevated exsanguinated with an Esmarch and tourniquet inflated to 250 mmHg.  C-arm fluoroscopy was utilized to identify the fracture site of the right index finger proximal phalanx.  Closed reduction was performed and percutaneous K wire 0.045 inch was placed in antegrade fashion from the metacarpal head down the intramedullary canal of the proximal phalanx. Four view radiographs of the right index finger was performed which confirmed adequate length alignment rotation of fracture and  placement of the hardware.  The pin was cut beneath the skin and the percutaneous K wire site was dressed.  A radial gutter splint was applied in intrinsic plus position.  The tourniquet was deflated and the fingers were pink and warm and well-perfused with brisk capillary refill.  All counts were correct x 2.  Patient was awoken from anesthesia and brought to PACU for recovery in stable condition.   Matt Holmes, MD

## 2023-01-10 NOTE — Anesthesia Preprocedure Evaluation (Signed)
Anesthesia Evaluation  Patient identified by MRN, date of birth, ID band Patient awake    Reviewed: Allergy & Precautions, H&P , NPO status , Patient's Chart, lab work & pertinent test results, reviewed documented beta blocker date and time   History of Anesthesia Complications Negative for: history of anesthetic complications  Airway Mallampati: II  TM Distance: >3 FB Neck ROM: Full    Dental  (+) Dental Advisory Given, Teeth Intact   Pulmonary neg pulmonary ROS   breath sounds clear to auscultation       Cardiovascular hypertension, Pt. on medications (-) angina  Rhythm:Regular Rate:Normal     Neuro/Psych   Anxiety Depression    negative neurological ROS     GI/Hepatic negative GI ROS,,,(+)     substance abuse  marijuana use  Endo/Other    Morbid obesity  Renal/GU negative Renal ROS     Musculoskeletal   Abdominal  (+) + obese  Peds  Hematology   Anesthesia Other Findings   Reproductive/Obstetrics 11/17/14 Preg test: neg                             Anesthesia Physical Anesthesia Plan  ASA: 3  Anesthesia Plan: MAC and Regional   Post-op Pain Management: Regional block* and Minimal or no pain anticipated   Induction: Intravenous  PONV Risk Score and Plan: 2 and Ondansetron, Midazolam and Treatment may vary due to age or medical condition  Airway Management Planned: Simple Face Mask  Additional Equipment:   Intra-op Plan:   Post-operative Plan:   Informed Consent: I have reviewed the patients History and Physical, chart, labs and discussed the procedure including the risks, benefits and alternatives for the proposed anesthesia with the patient or authorized representative who has indicated his/her understanding and acceptance.     Dental advisory given  Plan Discussed with: CRNA and Surgeon  Anesthesia Plan Comments:         Anesthesia Quick Evaluation

## 2023-01-10 NOTE — Discharge Instructions (Addendum)
Orthopaedic Hand Surgery Discharge Instructions  WEIGHT BEARING STATUS: Non weight bearing on operative extremity  DRESSING CARE: Please keep your dressing/splint/cast clean and dry until your follow-up appointment. You may shower by placing a waterproof covering over your dressing/splint/cast. Contact your surgeon if your splint/cast gets wet. It will need to be changed to prevent skin breakdown.  PAIN CONTROL: First line medications for post operative pain control are Tylenol (acetaminophen) and Motrin (ibuprofen) if you are able to take these medications. If you have been prescribed a medication these can be taken as breakthrough pain medications. Please note that some narcotic pain medication has acetaminophen added and you should never consume more than 4,000mg of acetaminophen in 24-hour period. Please note that if you are given Toradol (ketorolac) you should not take similar medications such as ibuprofen or naproxen.  DISCHARGE MEDICATIONS: If you have been prescribed medication it was sent electronically to your pharmacy. No changes have been made to your home medications.  ICE/ELEVATION: Ice and elevate your injured extremity as needed. Avoid direct contact of ice with skin.   BANDAGE FEELS TOO TIGHT: If your bandage feels too tight, first make sure you are elevating your fingers as much as possible. The outer layer of the bandage can be unwrapped and reapplied more loosely. If no improvement, you may carefully cut the inner layer longitudinally until the pressure has resolved and then rewrap the outer layer. If you are not comfortable with these instructions, please call the office and the bandage can be changed for you.   FOLLOW UP: You will be called after surgery with an appointment date and time, however if you have not received a phone call within 3 days, please call during regular office hours at [phone] to schedule a post operative appointment.  Please Seek Medical Attention  if: Call MD for: pain or pressure in chest, jaw, arm, back, neck  Call MD for: temperature greater than 101 F for more than 24 hrs Call MD for: difficulty breathing Call MD for: incision redness, bleeding, drainage  Call MD for: palpitations or feeling that the heart is racing  Call MD for: increased swelling in arm, leg, ankle, or abdomen  Call MD for: lightheadedness, dizziness, fainting Call 911 or go to ER for any medical emergency if you are not able to get in touch with your doctor   J. Reid Spears, MD Orthopaedic Hand Surgeon [facility] Office number: [phone] [address] [place], [place] [address]   Post Anesthesia Home Care Instructions  Activity: Get plenty of rest for the remainder of the day. A responsible individual must stay with you for 24 hours following the procedure.  For the next 24 hours, DO NOT: -Drive a car -Operate machinery -Drink alcoholic beverages -Take any medication unless instructed by your physician -Make any legal decisions or sign important papers.  Meals: Start with liquid foods such as gelatin or soup. Progress to regular foods as tolerated. Avoid greasy, spicy, heavy foods. If nausea and/or vomiting occur, drink only clear liquids until the nausea and/or vomiting subsides. Call your physician if vomiting continues.  Special Instructions/Symptoms: Your throat may feel dry or sore from the anesthesia or the breathing tube placed in your throat during surgery. If this causes discomfort, gargle with warm salt water. The discomfort should disappear within 24 hours.  If you had a scopolamine patch placed behind your ear for the management of post- operative nausea and/or vomiting:  1. The medication in the patch is effective for 72   after which it should be removed.  Wrap patch in a tissue and discard in the trash. Wash hands thoroughly with soap and water. 2. You may remove the patch earlier than 72 hours if you experience  unpleasant side effects which may include dry mouth, dizziness or visual disturbances. 3. Avoid touching the patch. Wash your hands with soap and water after contact with the patch.   Regional Anesthesia Blocks  1. Numbness or the inability to move the "blocked" extremity may last from 3-48 hours after placement. The length of time depends on the medication injected and your individual response to the medication. If the numbness is not going away after 48 hours, call your surgeon.  2. The extremity that is blocked will need to be protected until the numbness is gone and the  Strength has returned. Because you cannot feel it, you will need to take extra care to avoid injury. Because it may be weak, you may have difficulty moving it or using it. You may not know what position it is in without looking at it while the block is in effect.  3. For blocks in the legs and feet, returning to weight bearing and walking needs to be done carefully. You will need to wait until the numbness is entirely gone and the strength has returned. You should be able to move your leg and foot normally before you try and bear weight or walk. You will need someone to be with you when you first try to ensure you do not fall and possibly risk injury.  4. Bruising and tenderness at the needle site are common side effects and will resolve in a few days.  5. Persistent numbness or new problems with movement should be communicated to the surgeon or the [facility] ([phone])/ [facility] ([phone]). 

## 2023-01-11 ENCOUNTER — Encounter (HOSPITAL_BASED_OUTPATIENT_CLINIC_OR_DEPARTMENT_OTHER): Payer: Self-pay | Admitting: Orthopedic Surgery

## 2023-01-11 NOTE — Addendum Note (Signed)
Addendum  created 01/11/23 1005 by Lynda Rainwater, MD   Clinical Note Signed, Intraprocedure Blocks edited, SmartForm saved

## 2023-01-13 ENCOUNTER — Other Ambulatory Visit: Payer: Self-pay | Admitting: Family Medicine

## 2023-01-13 MED ORDER — TRAZODONE HCL 100 MG PO TABS: 100.0000 mg | ORAL_TABLET | Freq: Every evening | ORAL | 3 refills | Status: DC | PRN

## 2023-01-13 NOTE — Telephone Encounter (Signed)
Prescription Request  01/13/2023  Is this a "Controlled Substance" medicine? No  LOV: 10/27/2022  What is the name of the medication or equipment? traZODone (DESYREL) 100 MG tablet   Have you contacted your pharmacy to request a refill? Yes   Which pharmacy would you like this sent to?  [id number] [place] [place] [facility] [address] AT [place] [address] Phone: [phone] Fax: [phone]    Patient notified that their request is being sent to the clinical staff for review and that they should receive a response within 2 business days.   Please advise at [place]

## 2023-01-13 NOTE — Telephone Encounter (Signed)
Unable to refill per protocol, Rx request is too soon. Last refill 12/27/22 for 90 days and 3 refills.  Requested Prescriptions  Pending Prescriptions Disp Refills   traZODone (DESYREL) 100 MG tablet 90 tablet 3    Sig: Take 1 tablet (100 mg total) by mouth at bedtime as needed for sleep.     Psychiatry: Antidepressants - Serotonin Modulator Failed - 01/13/2023 11:14 AM      Failed - Valid encounter within last 6 months    Recent Outpatient Visits           1 year ago Neuropathy   [facility] [place], Cammie Mcgee, MD   1 year ago Depression, recurrent [facility])   [place], Jessica A, NP   1 year ago Anxiety and depression   [place] Eulogio Bear, NP   4 years ago Hyperlipidemia, unspecified hyperlipidemia type   [place] Orlena Sheldon, PA-C   5 years ago Major depressive disorder with single episode, in remission [facility])   [place], [place], PA-C       Future Appointments             In 1 week Pickard, Cammie Mcgee, MD [facility]            Passed - Completed PHQ-2 or PHQ-9 in the last 360 days

## 2023-01-16 ENCOUNTER — Telehealth: Payer: Self-pay | Admitting: Family Medicine

## 2023-01-16 NOTE — Telephone Encounter (Signed)
Prescription Request  01/16/2023  Is this a "Controlled Substance" medicine? No  LOV: 10/27/2022  What is the name of the medication or equipment? citalopram (CELEXA) 20 MG tablet   Have you contacted your pharmacy to request a refill? Yes   Which pharmacy would you like this sent to?  [id number] [place], [place] [address] [place] [address] Phone: [phone] Fax: [phone]    Patient notified that their request is being sent to the clinical staff for review and that they should receive a response within 2 business days.   Please advise at [place]

## 2023-01-17 NOTE — Telephone Encounter (Signed)
Unable to refill per protocol, Rx request is too soon 07/26/22 for 90 and 3 refills.  Requested Prescriptions  Pending Prescriptions Disp Refills   citalopram (CELEXA) 20 MG tablet 90 tablet 3    Sig: Take 1 tablet (20 mg total) by mouth daily.     Psychiatry:  Antidepressants - SSRI Failed - 01/16/2023 12:11 PM      Failed - Valid encounter within last 6 months    Recent Outpatient Visits           1 year ago Neuropathy   [place] Pickard, Cammie Mcgee, MD   1 year ago Depression, recurrent [facility])   [place], Jessica A, NP   1 year ago Anxiety and depression   [place] Eulogio Bear, NP   4 years ago Hyperlipidemia, unspecified hyperlipidemia type   [place] Orlena Sheldon, PA-C   5 years ago Major depressive disorder with single episode, in remission [facility])   [place], [place], PA-C       Future Appointments             In 1 week Pickard, Cammie Mcgee, MD [facility]            Passed - Completed PHQ-2 or PHQ-9 in the last 360 days

## 2023-01-18 NOTE — Telephone Encounter (Signed)
Received eFax from pharmacy to request 90 day supply; requires completion. Form placed on nurse's desk.   Pharmacy info:   [id number] [place], [place] [address] [place] [address] Phone: [phone] Fax: [phone]  Please advise pharmacist.

## 2023-01-19 ENCOUNTER — Encounter: Payer: Commercial Managed Care - HMO | Admitting: Family Medicine

## 2023-01-23 ENCOUNTER — Encounter: Payer: Self-pay | Admitting: General Practice

## 2023-01-24 ENCOUNTER — Ambulatory Visit: Payer: Self-pay | Admitting: Family Medicine

## 2023-02-13 ENCOUNTER — Ambulatory Visit: Payer: Commercial Managed Care - HMO | Admitting: Family Medicine

## 2023-02-14 ENCOUNTER — Ambulatory Visit
Admission: RE | Admit: 2023-02-14 | Discharge: 2023-02-14 | Disposition: A | Discharge status: Home / Self Care | Payer: BLUE CROSS/BLUE SHIELD | Source: Ambulatory Visit | Attending: Family Medicine | Admitting: Family Medicine

## 2023-02-14 DIAGNOSIS — Z1231 Encounter for screening mammogram for malignant neoplasm of breast: Secondary | ICD-10-CM

## 2023-02-28 ENCOUNTER — Other Ambulatory Visit: Payer: Self-pay | Admitting: Family Medicine

## 2023-02-28 NOTE — Telephone Encounter (Signed)
Prescription Request  02/28/2023  LOV: 10/27/2022  What is the name of the medication or equipment?   traZODone (DESYREL) 100 MG tablet   Have you contacted your pharmacy to request a refill? Yes   Which pharmacy would you like this sent to?  [id number] [place] [id number] [place] [id number] Phone: [phone] Fax: [phone]    Patient notified that their request is being sent to the clinical staff for review and that they should receive a response within 2 business days.   Please advise pharmacist.

## 2023-03-01 MED ORDER — TRAZODONE HCL 100 MG PO TABS: 100.0000 mg | ORAL_TABLET | Freq: Every evening | ORAL | 3 refills | Status: DC | PRN

## 2023-03-01 NOTE — Telephone Encounter (Signed)
Change pharmacy Requested Prescriptions  Pending Prescriptions Disp Refills   traZODone (DESYREL) 100 MG tablet 90 tablet 3    Sig: Take 1 tablet (100 mg total) by mouth at bedtime as needed for sleep.     Psychiatry: Antidepressants - Serotonin Modulator Failed - 02/28/2023  1:10 PM      Failed - Valid encounter within last 6 months    Recent Outpatient Visits           1 year ago Neuropathy   [place]ard, Cammie Mcgee, MD   1 year ago Depression, recurrent [facility])   [place], Jessica A, NP   1 year ago Anxiety and depression   [place] Eulogio Bear, NP   5 years ago Hyperlipidemia, unspecified hyperlipidemia type   [place] Dena Billet B, PA-C   6 years ago Major depressive disorder with single episode, in remission [facility])   [place], Mary B, PA-C              Passed - Completed PHQ-2 or PHQ-9 in the last 360 days

## 2023-03-08 ENCOUNTER — Encounter: Payer: BLUE CROSS/BLUE SHIELD | Admitting: Family Medicine

## 2023-03-20 ENCOUNTER — Other Ambulatory Visit: Payer: Self-pay

## 2023-03-20 ENCOUNTER — Telehealth: Payer: Self-pay

## 2023-03-20 DIAGNOSIS — F411 Generalized anxiety disorder: Secondary | ICD-10-CM

## 2023-03-20 DIAGNOSIS — F339 Major depressive disorder, recurrent, unspecified: Secondary | ICD-10-CM

## 2023-03-20 MED ORDER — CITALOPRAM HYDROBROMIDE 20 MG PO TABS: 20.0000 mg | ORAL_TABLET | Freq: Every day | ORAL | 1 refills | Status: DC

## 2023-03-20 NOTE — Telephone Encounter (Signed)
Prescription Request  03/20/2023  LOV: 02/13/23  What is the name of the medication or equipment? citalopram (CELEXA) 20 MG tablet [[id number]]  Have you contacted your pharmacy to request a refill? Yes   Which pharmacy would you like this sent to?  [id number] [place], [place] - [address] AT [facility] [address] [place] [address] Phone: [phone] Fax: [phone]    Patient notified that their request is being sent to the clinical staff for review and that they should receive a response within 2 business days.   Please advise at [facility] [phone]

## 2023-06-08 IMAGING — DX DG KNEE COMPLETE 4+V*L*
4 series · 4 of 4 positions shown · non-contrast
Comparison: None.

CLINICAL DATA: Injury 2 knees.  Fell on porch.

EXAM:
LEFT KNEE - COMPLETE 4+ VIEW

[knee ap]
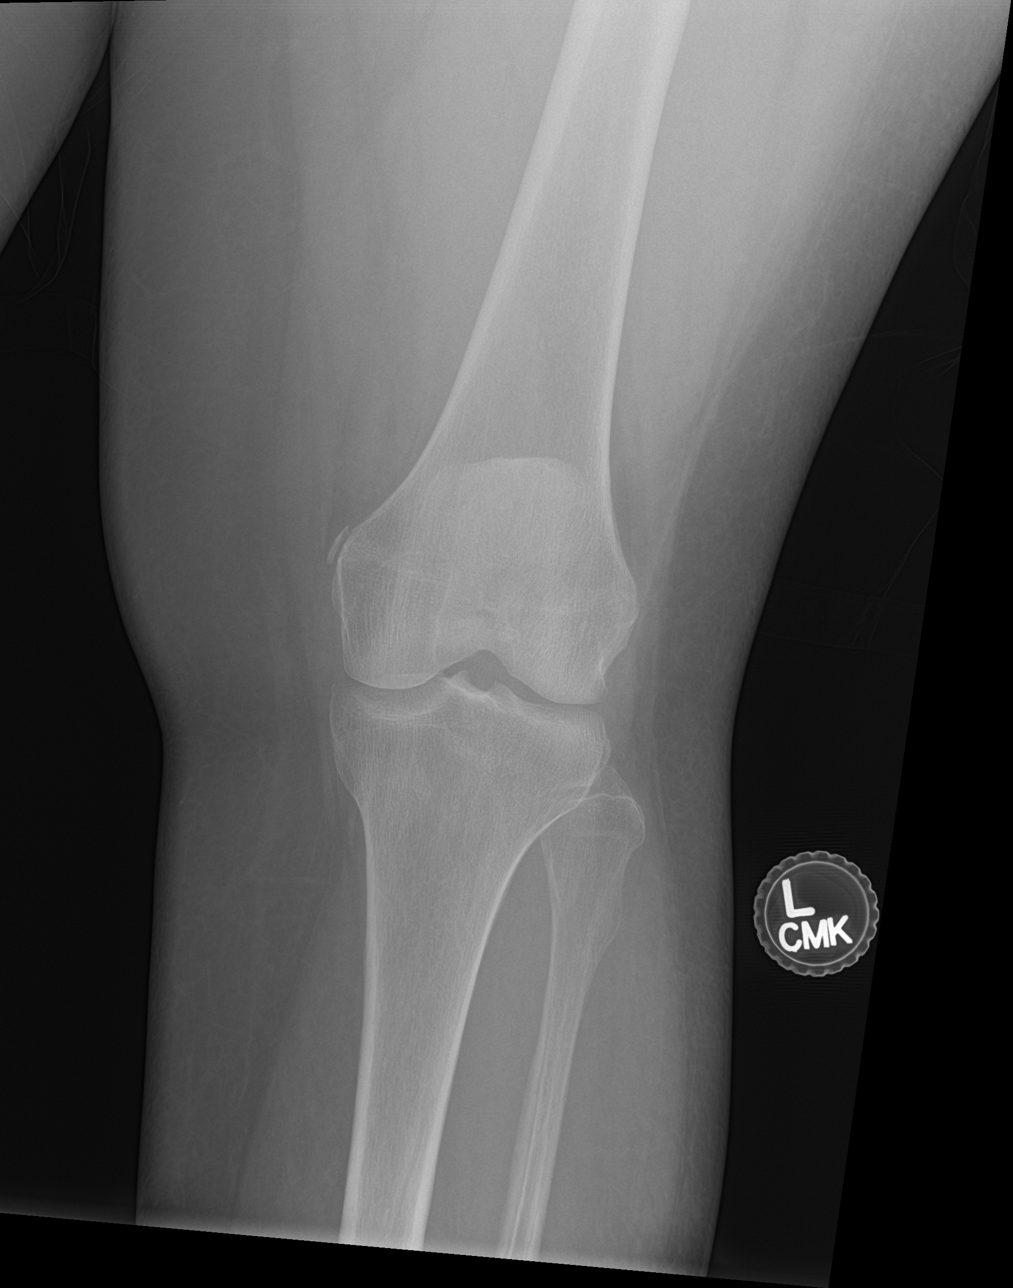

[knee lat]
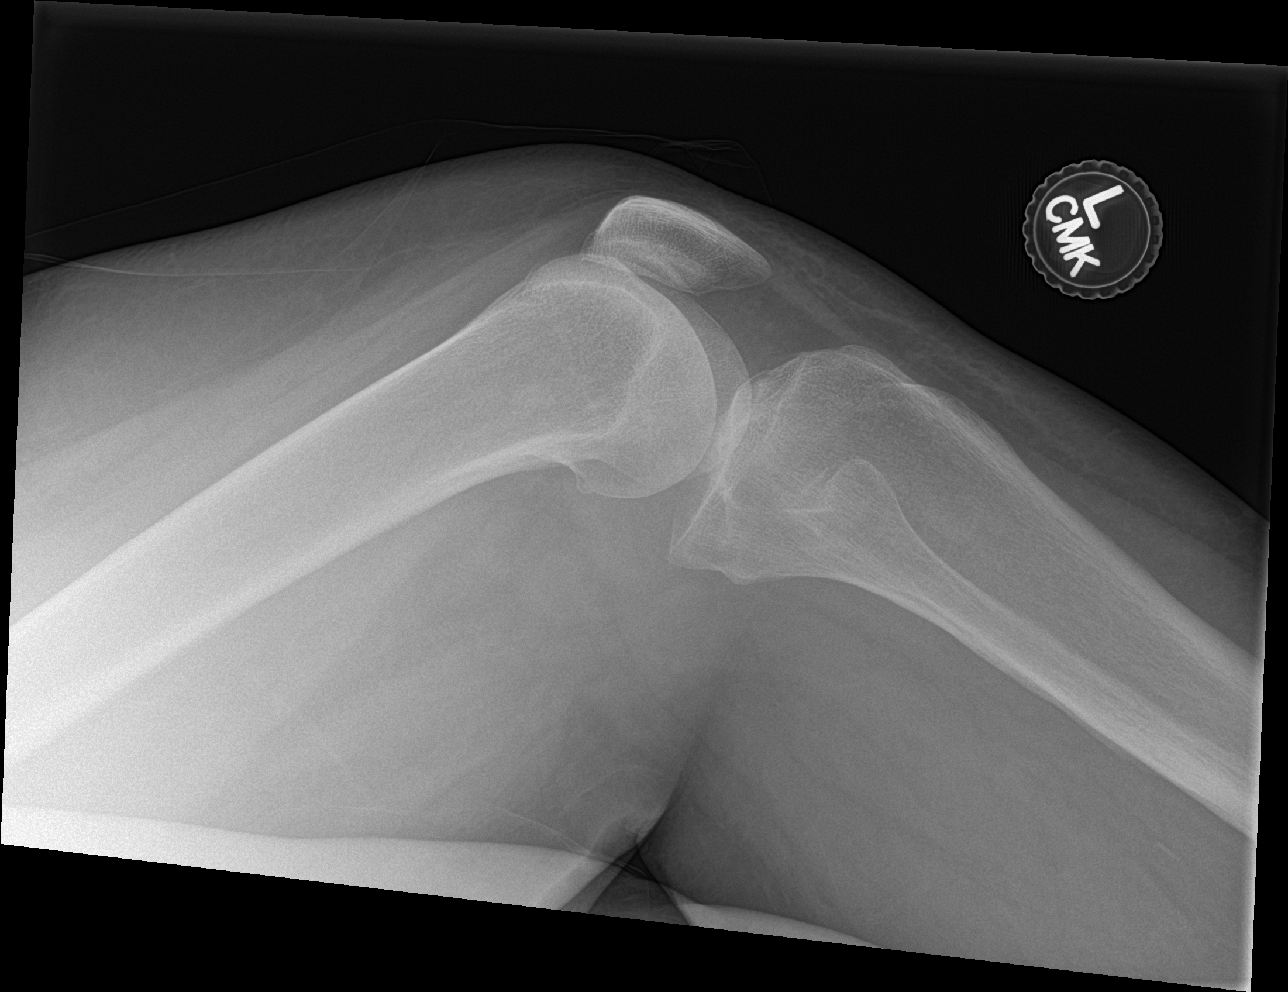

[knee obl (1 of 2)]
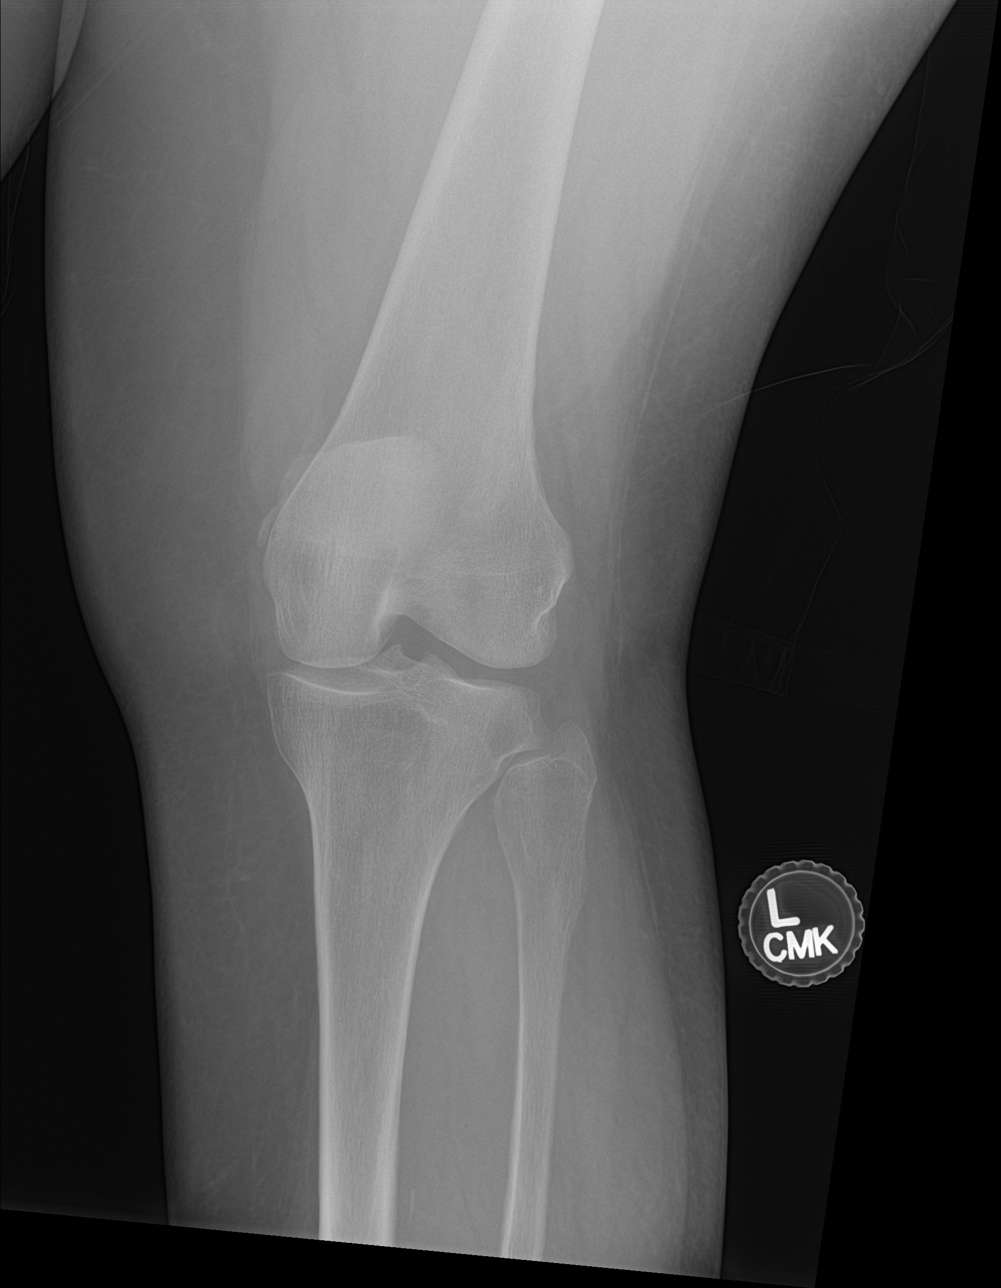

[knee obl (2 of 2)]
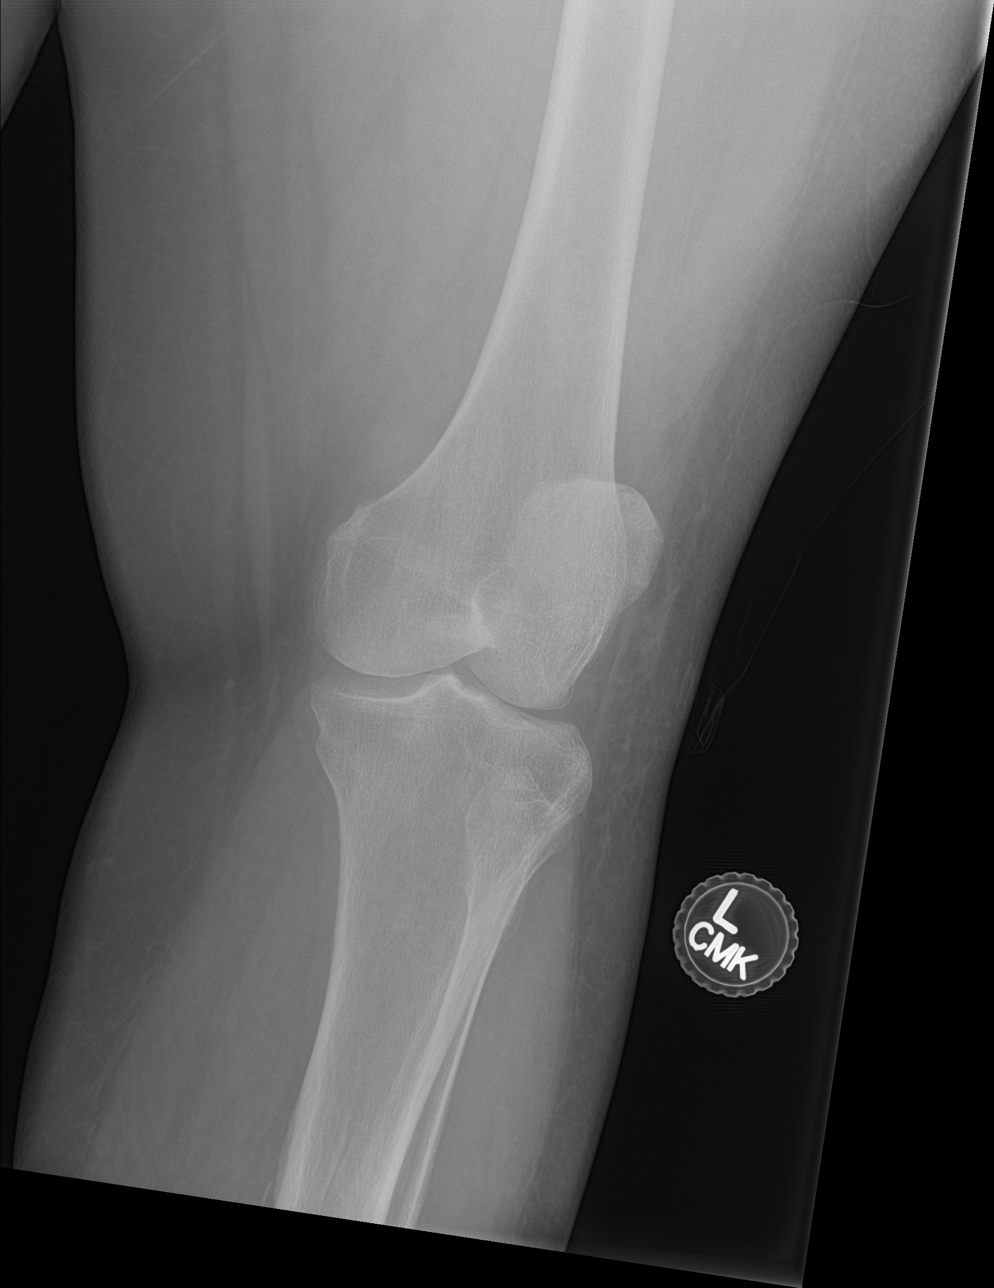

[4 of 4 positions shown; findings below may reference images not displayed]

FINDINGS: No joint effusion. Os ossific density adjacent to the medial femoral
condyle is identified compatible with Pellegrini-Stieda lesion. This
is compatible with sequelae of age-indeterminate medial collateral
ligament injury. No additional fracture or dislocation identified.
Mild degenerative changes noted.
IMPRESSION: 1. No acute findings.
2. Pellegrini-Stieda lesion compatible with sequelae of
age-indeterminate medial collateral ligament injury.

## 2023-06-26 ENCOUNTER — Other Ambulatory Visit: Payer: Self-pay | Admitting: Family Medicine

## 2023-06-26 DIAGNOSIS — F411 Generalized anxiety disorder: Secondary | ICD-10-CM

## 2023-06-26 DIAGNOSIS — F339 Major depressive disorder, recurrent, unspecified: Secondary | ICD-10-CM

## 2023-06-26 NOTE — Telephone Encounter (Signed)
[facility], Technician about the refill(s) citalopram and trazodone requested. Advised [facility] and if the refills could be pulled over. She said they did pull them over and have used all the refills, so will need new Rx.

## 2023-06-26 NOTE — Telephone Encounter (Signed)
Prescription Request  06/26/2023  LOV: 10/27/2022  What is the name of the medication or equipment?   traZODone (DESYREL) 100 MG tablet   citalopram (CELEXA) 20 MG tablet   Have you contacted your pharmacy to request a refill? Yes   Which pharmacy would you like this sent to?  [id number] [place] - [address] [place] [address] Phone: [phone] Fax: [phone]    Patient notified that their request is being sent to the clinical staff for review and that they should receive a response within 2 business days.   Please advise pharmacist.

## 2023-06-27 MED ORDER — TRAZODONE HCL 100 MG PO TABS: 100.0000 mg | ORAL_TABLET | Freq: Every evening | ORAL | 3 refills | Status: DC | PRN

## 2023-06-27 MED ORDER — CITALOPRAM HYDROBROMIDE 20 MG PO TABS: 20.0000 mg | ORAL_TABLET | Freq: Every day | ORAL | 1 refills | Status: DC

## 2023-06-27 NOTE — Telephone Encounter (Signed)
Requested medication (s) are due for refill today: na  Requested medication (s) are on the active medication list: yes  Last refill:  celexa- 03/20/23 #90 1 refills, trazadone - 3/20/#90 3 refills   Future visit scheduled: no   Notes to clinic:   last OV 10/27/22. No future visits. [facility], Technician about the refill(s) citalopram and trazodone requested. Advised they were [facility] another [facility] and if the refills could be pulled over. She said they did pull them over and have used all the refills, so will need new Rx.  Please write for new Rx.     Requested Prescriptions  Pending Prescriptions Disp Refills   citalopram (CELEXA) 20 MG tablet 90 tablet 1    Sig: Take 1 tablet (20 mg total) by mouth daily.     Psychiatry:  Antidepressants - SSRI Failed - 06/26/2023 11:13 AM      Failed - Valid encounter within last 6 months    Recent Outpatient Visits           1 year ago Neuropathy   [facility] [place], Priscille Heidelberg, MD   1 year ago Depression, recurrent [facility])   [facility] Valentino Nose, NP   2 years ago Anxiety and depression   [facility] Valentino Nose, NP   5 years ago Hyperlipidemia, unspecified hyperlipidemia type   [facility] Dorena Bodo, PA-C   6 years ago Major depressive disorder with single episode, in remission (HCC)   [facility] Dorena Bodo, PA-C              Passed - Completed PHQ-2 or PHQ-9 in the last 360 days       traZODone (DESYREL) 100 MG tablet 90 tablet 3    Sig: Take 1 tablet (100 mg total) by mouth at bedtime as needed for sleep.     Psychiatry: Antidepressants - Serotonin Modulator Failed - 06/26/2023 11:13 AM      Failed - Valid encounter within last 6 months    Recent Outpatient Visits           1 year ago Neuropathy   [facility] Pickard, Priscille Heidelberg, MD   1 year ago Depression, recurrent [facility])    [facility] Valentino Nose, NP   2 years ago Anxiety and depression   [facility] Valentino Nose, NP   5 years ago Hyperlipidemia, unspecified hyperlipidemia type   [facility] Allayne Butcher B, PA-C   6 years ago Major depressive disorder with single episode, in remission [facility])   [facility] Dorena Bodo, PA-C              Passed - Completed PHQ-2 or PHQ-9 in the last 360 days

## 2023-12-18 ENCOUNTER — Other Ambulatory Visit: Payer: Self-pay | Admitting: Family Medicine

## 2023-12-18 DIAGNOSIS — F411 Generalized anxiety disorder: Secondary | ICD-10-CM

## 2023-12-18 DIAGNOSIS — F339 Major depressive disorder, recurrent, unspecified: Secondary | ICD-10-CM

## 2024-03-28 ENCOUNTER — Other Ambulatory Visit: Payer: Self-pay | Admitting: Family Medicine

## 2024-03-28 DIAGNOSIS — F411 Generalized anxiety disorder: Secondary | ICD-10-CM

## 2024-03-28 DIAGNOSIS — F339 Major depressive disorder, recurrent, unspecified: Secondary | ICD-10-CM

## 2024-03-29 NOTE — Telephone Encounter (Signed)
 Unable to refill per protocol, courtesy refill already given, OV needed.  Requested Prescriptions  Pending Prescriptions Disp Refills   citalopram  (CELEXA ) 20 MG tablet [Pharmacy Med Name: Citalopram  Hydrobromide 20 MG Oral Tablet] 90 tablet 0    Sig: Take 1 tablet by mouth once daily     Psychiatry:  Antidepressants - SSRI Failed - 03/29/2024 11:15 AM      Failed - Completed PHQ-2 or PHQ-9 in the last 360 days      Failed - Valid encounter within last 6 months    Recent Outpatient Visits           1 year ago Pure hypercholesterolemia   [place] [facility] Pickard, Cisco Crest, MD   1 year ago Colon cancer screening    [facility] Pickard, Cisco Crest, MD

## 2024-04-01 ENCOUNTER — Other Ambulatory Visit: Payer: Self-pay

## 2024-04-01 DIAGNOSIS — F339 Major depressive disorder, recurrent, unspecified: Secondary | ICD-10-CM

## 2024-04-01 DIAGNOSIS — F411 Generalized anxiety disorder: Secondary | ICD-10-CM

## 2024-04-01 NOTE — Telephone Encounter (Signed)
 Prescription Request  04/01/2024  LOV: 10/27/22  What is the name of the medication or equipment? citalopram  (CELEXA ) 20 MG tablet [[id number]]   Have you contacted your pharmacy to request a refill? Yes   Which pharmacy would you like this sent to?  [facility] [address] [place], [place] - [address], [place] [address] Phone: [phone]  Fax: [phone]    Patient notified that their request is being sent to the clinical staff for review and that they should receive a response within 2 business days.   Please advise at [facility] [phone]

## 2024-04-01 NOTE — Telephone Encounter (Signed)
 Requested medications are due for refill today.  yes  Requested medications are on the active medications list.  yes  Last refill. 12/19/2023 #90 0 rf  Future visit scheduled.   no  Notes to clinic.  Pt last seen 10/27/2022. Pt is more than 3 months overdue for an ov. Please advise.    Requested Prescriptions  Pending Prescriptions Disp Refills   citalopram  (CELEXA ) 20 MG tablet 90 tablet 0    Sig: Take 1 tablet (20 mg total) by mouth daily.     Psychiatry:  Antidepressants - SSRI Failed - 04/01/2024  5:49 PM      Failed - Completed PHQ-2 or PHQ-9 in the last 360 days      Failed - Valid encounter within last 6 months    Recent Outpatient Visits           1 year ago Pure hypercholesterolemia   [facility] Pickard, Cisco Crest, MD   1 year ago Colon cancer screening   [facility] Pickard, Cisco Crest, MD

## 2024-04-04 ENCOUNTER — Telehealth: Payer: Self-pay

## 2024-04-04 NOTE — Telephone Encounter (Signed)
 Copied from CRM [id number]. Topic: Clinical - Medication Question >> Apr 02, 2024  1:34 PM [place] wrote: Reason for CRM: checking on refill status for citalopram  (CELEXA ) 20 MG tablet >> Apr 04, 2024 12:44 PM Emylou G wrote: Patient checking status.. said had death in the family.. can use some samples til it gets ordered.Aaron Aas

## 2024-04-05 ENCOUNTER — Other Ambulatory Visit: Payer: Self-pay | Admitting: Family Medicine

## 2024-04-05 DIAGNOSIS — F339 Major depressive disorder, recurrent, unspecified: Secondary | ICD-10-CM

## 2024-04-05 DIAGNOSIS — F411 Generalized anxiety disorder: Secondary | ICD-10-CM

## 2024-04-05 MED ORDER — CITALOPRAM HYDROBROMIDE 20 MG PO TABS
20.0000 mg | ORAL_TABLET | Freq: Every day | ORAL | 0 refills | Status: DC
Start: 2024-04-05 — End: 2024-04-08

## 2024-04-05 NOTE — Telephone Encounter (Signed)
 Requested medication (s) are due for refill today: yes  Requested medication (s) are on the active medication list: yes  Last refill:  12/19/23 #90/0  Future visit scheduled: yes 04/08/24  Notes to clinic:  pt requesting refill prior to appt     Requested Prescriptions  Pending Prescriptions Disp Refills   citalopram  (CELEXA ) 20 MG tablet 90 tablet 0    Sig: Take 1 tablet (20 mg total) by mouth daily.     Psychiatry:  Antidepressants - SSRI Failed - 04/05/2024  4:54 PM      Failed - Completed PHQ-2 or PHQ-9 in the last 360 days      Failed - Valid encounter within last 6 months    Recent Outpatient Visits           1 year ago Pure hypercholesterolemia   [place] [facility] Pickard, Cisco Crest, MD   1 year ago Colon cancer screening   [facility] Pickard, Cisco Crest, MD

## 2024-04-05 NOTE — Telephone Encounter (Signed)
 Copied from CRM [id number]. Topic: Clinical - Medication Refill >> Apr 05, 2024  2:16 PM Felizardo Hotter wrote: Most Recent Primary Care Visit:  Provider: Jenelle Mis  [facility]  Visit Type: PROCEDURE 30  Date: 11/10/2022  Medication: citalopram  (CELEXA ) 20 MG tablet  Has the patient contacted their pharmacy? Yes (Agent: If no, request that the patient contact the pharmacy for the refill. If patient does not wish to contact the pharmacy document the reason why and proceed with request.) (Agent: If yes, when and what did the pharmacy advise?)  Is this the correct pharmacy for this prescription? Yes If no, delete pharmacy and type the correct one.  This is the patient's preferred pharmacy:   [facility] [address] [place], [place] - [address] [place] [address] Phone: [phone] Fax: [phone]  Has the prescription been filled recently? Yes  Is the patient out of the medication? Yes  Has the patient been seen for an appointment in the last year OR does the patient have an upcoming appointment? Yes  Can we respond through MyChart? Yes  Agent: Please be advised that Rx refills may take up to 3 business days. We ask that you follow-up with your pharmacy.

## 2024-04-08 ENCOUNTER — Ambulatory Visit (INDEPENDENT_AMBULATORY_CARE_PROVIDER_SITE_OTHER): Admitting: Family Medicine

## 2024-04-08 ENCOUNTER — Encounter: Payer: Self-pay | Admitting: Family Medicine

## 2024-04-08 VITALS — BP 130/82 | HR 88 | Temp 97.9°F | Ht 62.0 in | Wt 232.0 lb

## 2024-04-08 DIAGNOSIS — R300 Dysuria: Secondary | ICD-10-CM

## 2024-04-08 DIAGNOSIS — F411 Generalized anxiety disorder: Secondary | ICD-10-CM

## 2024-04-08 DIAGNOSIS — F339 Major depressive disorder, recurrent, unspecified: Secondary | ICD-10-CM

## 2024-04-08 DIAGNOSIS — Z1231 Encounter for screening mammogram for malignant neoplasm of breast: Secondary | ICD-10-CM

## 2024-04-08 DIAGNOSIS — R87619 Unspecified abnormal cytological findings in specimens from cervix uteri: Secondary | ICD-10-CM

## 2024-04-08 DIAGNOSIS — E785 Hyperlipidemia, unspecified: Secondary | ICD-10-CM

## 2024-04-08 MED ORDER — ROSUVASTATIN CALCIUM 10 MG PO TABS
10.0000 mg | ORAL_TABLET | Freq: Every day | ORAL | 3 refills | Status: DC
Start: 2024-04-08 — End: 2024-04-08

## 2024-04-08 MED ORDER — CEPHALEXIN 500 MG PO CAPS: 500.0000 mg | ORAL_CAPSULE | Freq: Three times a day (TID) | ORAL | 0 refills | Status: AC

## 2024-04-08 MED ORDER — ROSUVASTATIN CALCIUM 10 MG PO TABS
10.0000 mg | ORAL_TABLET | Freq: Every day | ORAL | 3 refills | Status: DC
Start: 2024-04-08 — End: 2024-04-09

## 2024-04-08 MED ORDER — TRAZODONE HCL 100 MG PO TABS: 100.0000 mg | ORAL_TABLET | Freq: Every evening | ORAL | 3 refills | Status: AC | PRN

## 2024-04-08 MED ORDER — TRAZODONE HCL 100 MG PO TABS
100.0000 mg | ORAL_TABLET | Freq: Every evening | ORAL | 3 refills | Status: DC | PRN
Start: 2024-04-08 — End: 2024-04-08

## 2024-04-08 MED ORDER — CITALOPRAM HYDROBROMIDE 20 MG PO TABS
20.0000 mg | ORAL_TABLET | Freq: Every day | ORAL | 0 refills | Status: DC
Start: 2024-04-08 — End: 2024-04-08

## 2024-04-08 MED ORDER — CITALOPRAM HYDROBROMIDE 20 MG PO TABS: 20.0000 mg | ORAL_TABLET | Freq: Every day | ORAL | 3 refills | Status: DC

## 2024-04-08 NOTE — Progress Notes (Signed)
 Subjective:    Patient ID: Emily Franco, female    DOB: [birth year], 53 y.o.   MRN: [id number]  Dysuria     Patient is here today for follow-up.  She has a history of depression and is currently on citalopram .  She also uses trazodone  50 mg at night to help her sleep.  We requested that she come in for an appointment because she was overdue for fasting lab work.  Patient was on Crestor  for cholesterol.  She states that she has been out of the medicine for over 2 months.  She is overdue for mammogram.  Her last Pap smear was performed over 2 years ago.  At that time she had ASCUS with high risk HPV detected.  We referred the patient to gynecology but this never happened and the patient did not follow-up.  I explained this to her in detail today and recommended that she see a gynecologist.  Her Cologuard is up-to-date.  Patient states that she is happy with the performance of the citalopram .  She does complain of a 2-week history of dysuria, frequency, and urgency.  Urinalysis shows trace blood and 3+ leukocyte esterase. Past Medical History:  Diagnosis Date   Anxiety    history of not lately   Depression    Depression    Hyperlipidemia    Hypertension    Neuropathy    Obesity    Past Surgical History:  Procedure Laterality Date   OPEN REDUCTION INTERNAL FIXATION (ORIF) PROXIMAL PHALANX Right 01/10/2023   Procedure: Right index finger proximal phalanx closed reduction percutaneus pinning versus open reduction internal fixation;  Surgeon: Ltanya Rummer, MD;  Location: [facility];  Service: Orthopedics;  Laterality: Right;   ORIF TOE FRACTURE Right 11/17/2014   Procedure: OPEN REDUCTION INTERNAL FIXATION (ORIF) RIGHT 5TH METATARSAL FRACTURE;  Surgeon: Edison Gore, MD;  Location: [facility];  Service: Orthopedics;  Laterality: Right;   WISDOM TOOTH EXTRACTION     "laughing gas"   No current outpatient medications on file prior to visit.   No current  facility-administered medications on file prior to visit.   No Known Allergies Social History   Socioeconomic History   Marital status: Divorced    Spouse name: Not on file   Number of children: Not on file   Years of education: Not on file   Highest education level: Not on file  Occupational History   Not on file  Tobacco Use   Smoking status: Never   Smokeless tobacco: Never  Vaping Use   Vaping status: Never Used  Substance and Sexual Activity   Alcohol use: Not Currently    Alcohol/week: 4.0 standard drinks of alcohol    Types: 4 Glasses of wine per week    Comment: occasional   Drug use: Yes    Types: Marijuana    Comment: occ   Sexual activity: Not on file  Other Topics Concern   Not on file  Social History Narrative   Son died 02-22-24 due to overdose.   Another son died 04/23/2020 due to overdose.   Daughter living.    Social Drivers of Corporate investment banker Strain: Not on file  Food Insecurity: Not on file  Transportation Needs: Not on file  Physical Activity: Not on file  Stress: Not on file  Social Connections: Unknown (04/26/2022)   [facility] from [facility], [facility]   Social Network    Social Network: Not on file  Intimate Partner Violence:  Unknown (03/18/2022)   [facility], [facility]   HITS    Physically Hurt: Not on file    Insult or Talk Down To: Not on file    Threaten Physical Harm: Not on file    Scream or Curse: Not on file     Review of Systems  Genitourinary:  Positive for dysuria.  All other systems reviewed and are negative.      Objective:   Physical Exam Vitals reviewed.  Constitutional:      Appearance: Normal appearance. She is obese.  Cardiovascular:     Rate and Rhythm: Normal rate and regular rhythm.     Heart sounds: Normal heart sounds. No murmur heard.    No friction rub. No gallop.  Neurological:     General: No focal deficit present.     Mental Status: She is alert and oriented to  person, place, and time. Mental status is at baseline.  Psychiatric:        Mood and Affect: Mood normal.        Behavior: Behavior normal.        Thought Content: Thought content normal.        Judgment: Judgment normal.           Assessment & Plan:   Dysuria - Plan: Urinalysis, Routine w reflex microscopic  Depression, recurrent (HCC) - Plan: traZODone  (DESYREL ) 100 MG tablet, citalopram  (CELEXA ) 20 MG tablet, DISCONTINUED: citalopram  (CELEXA ) 20 MG tablet, DISCONTINUED: traZODone  (DESYREL ) 100 MG tablet  Generalized anxiety disorder - Plan: traZODone  (DESYREL ) 100 MG tablet, citalopram  (CELEXA ) 20 MG tablet, DISCONTINUED: citalopram  (CELEXA ) 20 MG tablet, DISCONTINUED: traZODone  (DESYREL ) 100 MG tablet  Hyperlipidemia, unspecified hyperlipidemia type - Plan: rosuvastatin  (CRESTOR ) 10 MG tablet, CBC with Differential/Platelet, COMPLETE METABOLIC PANEL WITHOUT GFR, Lipid panel, DISCONTINUED: rosuvastatin  (CRESTOR ) 10 MG tablet  ASCUS (atypical squamous cells of undetermined significance) on gynecologic Papanicolaou smear complicating pregnancy, antepartum - Plan: Ambulatory referral to Gynecology  Encounter for screening mammogram for malignant neoplasm of breast - Plan: MM Digital Screening Patient appears to have cystitis.  Begin Keflex  500 mg 3 times daily for 5 days.  Schedule patient for mammogram.  Consult gynecology given her history of abnormal Pap smears.  Patient is overdue for repeat Pap smear.  Check CBC, CMP, and fasting lipid panel.  Goal LDL cholesterol is less than 130.  Patient has been out of rosuvastatin  for over 2 months.

## 2024-04-09 ENCOUNTER — Other Ambulatory Visit: Payer: Self-pay | Admitting: Family Medicine

## 2024-04-09 DIAGNOSIS — E785 Hyperlipidemia, unspecified: Secondary | ICD-10-CM

## 2024-04-09 LAB — COMPLETE METABOLIC PANEL WITHOUT GFR
AG Ratio: 1.6 (calc) (ref 1.0–2.5)
ALT: 27 U/L (ref 6–29)
AST: 16 U/L (ref 10–35)
Albumin: 4 g/dL (ref 3.6–5.1)
Alkaline phosphatase (APISO): 84 U/L (ref 37–153)
BUN/Creatinine Ratio: 8 (calc) (ref 6–22)
BUN: 5 mg/dL — ABNORMAL LOW (ref 7–25)
CO2: 29 mmol/L (ref 20–32)
Calcium: 9.5 mg/dL (ref 8.6–10.4)
Chloride: 102 mmol/L (ref 98–110)
Creat: 0.61 mg/dL (ref 0.50–1.03)
Globulin: 2.5 g/dL (ref 1.9–3.7)
Glucose, Bld: 97 mg/dL (ref 65–99)
Potassium: 4.1 mmol/L (ref 3.5–5.3)
Sodium: 140 mmol/L (ref 135–146)
Total Bilirubin: 0.4 mg/dL (ref 0.2–1.2)
Total Protein: 6.5 g/dL (ref 6.1–8.1)

## 2024-04-09 LAB — CBC WITH DIFFERENTIAL/PLATELET
Absolute Lymphocytes: 1540 {cells}/uL (ref 850–3900)
Absolute Monocytes: 502 {cells}/uL (ref 200–950)
Basophils Absolute: 30 {cells}/uL (ref 0–200)
Basophils Relative: 0.5 %
Eosinophils Absolute: 100 {cells}/uL (ref 15–500)
Eosinophils Relative: 1.7 %
HCT: 44.3 % (ref 35.0–45.0)
Hemoglobin: 14.5 g/dL (ref 11.7–15.5)
MCH: 29.3 pg (ref 27.0–33.0)
MCHC: 32.7 g/dL (ref 32.0–36.0)
MCV: 89.5 fL (ref 80.0–100.0)
MPV: 8.7 fL (ref 7.5–12.5)
Monocytes Relative: 8.5 %
Neutro Abs: 3729 {cells}/uL (ref 1500–7800)
Neutrophils Relative %: 63.2 %
Platelets: 445 10*3/uL — ABNORMAL HIGH (ref 140–400)
RBC: 4.95 10*6/uL (ref 3.80–5.10)
RDW: 13.1 % (ref 11.0–15.0)
Total Lymphocyte: 26.1 %
WBC: 5.9 10*3/uL (ref 3.8–10.8)

## 2024-04-09 LAB — URINALYSIS, ROUTINE W REFLEX MICROSCOPIC
Bilirubin Urine: NEGATIVE
Glucose, UA: NEGATIVE
Hyaline Cast: NONE SEEN /LPF
Ketones, ur: NEGATIVE
Nitrite: NEGATIVE
Protein, ur: NEGATIVE
Specific Gravity, Urine: 1.01 (ref 1.001–1.035)
pH: 7 (ref 5.0–8.0)

## 2024-04-09 LAB — LIPID PANEL
Cholesterol: 213 mg/dL — ABNORMAL HIGH (ref <200)
HDL: 45 mg/dL — ABNORMAL LOW (ref >=50)
LDL Cholesterol (Calc): 149 mg/dL — ABNORMAL HIGH
Non-HDL Cholesterol (Calc): 168 mg/dL — ABNORMAL HIGH (ref <130)
Total CHOL/HDL Ratio: 4.7 (calc) (ref <5.0)
Triglycerides: 87 mg/dL (ref <150)

## 2024-04-09 LAB — MICROSCOPIC MESSAGE

## 2024-04-09 NOTE — Telephone Encounter (Signed)
 Copied from CRM [id number]. Topic: Clinical - Medication Refill >> Apr 09, 2024  1:52 PM Emylou G wrote: Most Recent Primary Care Visit:  Provider: Eliane Grooms T  Department: [facility]  Visit Type: OFFICE VISIT  Date: 04/08/2024  Medication: rosuvastatin  (CRESTOR ) 10 MG tablet  Has the patient contacted their pharmacy? Yes (Agent: If no, request that the patient contact the pharmacy for the refill. If patient does not wish to contact the pharmacy document the reason why and proceed with request.) (Agent: If yes, when and what did the pharmacy advise?) said to call  Is this the correct pharmacy for this prescription? Yes If no, delete pharmacy and type the correct one.  This is the patient's preferred pharmacy:    [facility] [address] [place], [place] - [address] [place] [address] Phone: [phone] Fax: [phone]   Has the prescription been filled recently? No  Is the patient out of the medication? Yes  Has the patient been seen for an appointment in the last year OR does the patient have an upcoming appointment? Yes  Can we respond through MyChart? No  Agent: Please be advised that Rx refills may take up to 3 business days. We ask that you follow-up with your pharmacy.

## 2024-04-12 MED ORDER — ROSUVASTATIN CALCIUM 10 MG PO TABS: 10.0000 mg | ORAL_TABLET | Freq: Every day | ORAL | 3 refills | Status: DC

## 2024-04-12 NOTE — Telephone Encounter (Signed)
 Change in pharmacy requested Requested Prescriptions  Pending Prescriptions Disp Refills   rosuvastatin  (CRESTOR ) 10 MG tablet 90 tablet 3    Sig: Take 1 tablet (10 mg total) by mouth daily.     Cardiovascular:  Antilipid - Statins 2 Failed - 04/12/2024  9:46 AM      Failed - Valid encounter within last 12 months    Recent Outpatient Visits           4 days ago Dysuria   [place] [facility] Pickard, Cisco Crest, MD   1 year ago Pure hypercholesterolemia   [facility] Pickard, Cisco Crest, MD   1 year ago Colon cancer screening   [place] [facility] Austine Lefort, MD              Failed - Lipid Panel in normal range within the last 12 months    Cholesterol  Date Value Ref Range Status  04/08/2024 213 (H) <200 mg/dL Final   LDL Cholesterol (Calc)  Date Value Ref Range Status  04/08/2024 149 (H) mg/dL (calc) Final    Comment:    Reference range: <100 . Desirable range <100 mg/dL for primary prevention;   <70 mg/dL for patients with CHD or diabetic patients  with > or = 2 CHD risk factors. Aaron Aas LDL-C is now calculated using the Martin-Hopkins  calculation, which is a validated novel method providing  better accuracy than the Friedewald equation in the  estimation of LDL-C.  Melinda Sprawls et al. Erroll Heard. 1610;960(45): 2061-2068  (http://education.[facility].com/faq/FAQ164)    HDL  Date Value Ref Range Status  04/08/2024 45 (L) > OR = 50 mg/dL Final   Triglycerides  Date Value Ref Range Status  04/08/2024 87 <150 mg/dL Final         Passed - Cr in normal range and within 360 days    Creat  Date Value Ref Range Status  04/08/2024 0.61 0.50 - 1.03 mg/dL Final         Passed - Patient is not pregnant

## 2024-04-24 ENCOUNTER — Telehealth: Payer: Self-pay

## 2024-04-24 ENCOUNTER — Other Ambulatory Visit: Payer: Self-pay

## 2024-04-24 DIAGNOSIS — Z124 Encounter for screening for malignant neoplasm of cervix: Secondary | ICD-10-CM

## 2024-04-24 DIAGNOSIS — O282 Abnormal cytological finding on antenatal screening of mother: Secondary | ICD-10-CM

## 2024-04-24 NOTE — Telephone Encounter (Signed)
 Copied from CRM [id number]. Topic: Referral - Question >> Apr 24, 2024  2:21 PM Jeris Montes S wrote: Reason for CRM: Pt calling stated was told to contact if could not find a GYN doctor. Needs a referral for GYN.

## 2024-05-20 ENCOUNTER — Telehealth: Payer: Self-pay | Admitting: Family Medicine

## 2024-05-20 NOTE — Telephone Encounter (Signed)
 Copied from CRM [id number]. Topic: Referral - Question >> May 20, 2024 12:42 PM Everette C wrote: Reason for CRM: The patient would like to be contacted by a member of practice staff when possible over the phone to discuss potential options they have to be referred for a regular mammogram and papsmear that will be covered by their insurance   The patient would like to be contacted telephonically prior to request a referral

## 2024-05-21 ENCOUNTER — Telehealth: Payer: Self-pay

## 2024-05-21 ENCOUNTER — Other Ambulatory Visit: Payer: Self-pay

## 2024-05-21 NOTE — Telephone Encounter (Signed)
 Good afternoon our PA team do not handle medical PA's, we only handle prescriptions.

## 2024-05-21 NOTE — Telephone Encounter (Signed)
 PA team can not assist with this matter. Will reach out to other teams.

## 2024-05-21 NOTE — Telephone Encounter (Signed)
 Sent to the PA team for approval

## 2024-05-22 NOTE — Telephone Encounter (Signed)
 You're welcome!

## 2024-09-30 ENCOUNTER — Other Ambulatory Visit: Payer: Self-pay

## 2024-09-30 ENCOUNTER — Telehealth: Payer: Self-pay | Admitting: Family Medicine

## 2024-09-30 DIAGNOSIS — F339 Major depressive disorder, recurrent, unspecified: Secondary | ICD-10-CM

## 2024-09-30 DIAGNOSIS — E785 Hyperlipidemia, unspecified: Secondary | ICD-10-CM

## 2024-09-30 DIAGNOSIS — F411 Generalized anxiety disorder: Secondary | ICD-10-CM

## 2024-09-30 MED ORDER — ROSUVASTATIN CALCIUM 10 MG PO TABS: 10.0000 mg | ORAL_TABLET | Freq: Every day | ORAL | 1 refills | Status: AC

## 2024-09-30 MED ORDER — CITALOPRAM HYDROBROMIDE 20 MG PO TABS
20.0000 mg | ORAL_TABLET | Freq: Every day | ORAL | 1 refills | Status: AC
Start: 2024-09-30 — End: ?

## 2024-09-30 NOTE — Telephone Encounter (Signed)
 Copied from CRM #[id number]. Topic: Clinical - Medication Question >> Sep 30, 2024  2:28 PM Precious C wrote: Reason for CRM: Patient called in stating that her health insurance is currently inactive, which has made her medications too expensive to afford. She is requesting a one-month supply of her medications: Citalopram  20 mg tablets Rosuvastatin  10 mg tablets  The patient also inquired about possible assistance programs or resources to help with the cost of her medications.   CallbackGertrue, Willette [phone]

## 2024-10-28 ENCOUNTER — Ambulatory Visit: Payer: Self-pay

## 2024-10-28 NOTE — Telephone Encounter (Signed)
 FYI Only or Action Required?: FYI only for provider: appointment scheduled on difficulty going outside, virtual appt. Scheduled for 10/30/2024.  Patient was last seen in primary care on 04/08/2024 by Duanne Butler DASEN, MD.  Called Nurse Triage reporting Dizziness and Anxiety.  Symptoms began several years ago.  Interventions attempted: Prescription medications: trazadone, celexa .  Symptoms are: gradually worsening.  Triage Disposition: See Physician Within 24 Hours  Patient/caregiver understands and will follow disposition?: Yes  Copied from CRM #[id number]. Topic: Clinical - Red Word Triage >> Oct 28, 2024  3:16 PM Joesph NOVAK wrote: Red Word that prompted transfer to Nurse Triage: Patient called to request a cane because when she goes outside she falls, her last fall was last month. She mentioned her anxiety and depression has increased because she lost her sons and has no one to walk outside with her. Reason for Disposition  Patient sounds very upset or troubled to the triager  Answer Assessment - Initial Assessment Questions Information provided for Crisis Text Line [phone]   1. CONCERN: Did anything happen that prompted you to call today?      Falling from being dizzy/unbalanced outside 2. ANXIETY SYMPTOMS: Can you describe how you (your loved one; patient) have been feeling? (e.g., tense, restless, panicky, anxious, keyed up, overwhelmed, sense of impending doom).      Overwhelmed with being outside.  Son died 6 months ago, preceded by the death or her other son four years ago.  Reports that anxiety began before this when her children were diagnosed with diabetes 3. ONSET: How long have you been feeling this way? (e.g., hours, days, weeks)     More than ten years ago 4. SEVERITY: How would you rate the level of anxiety? (e.g., 0 - 10; or mild, moderate, severe).     moderate 5. FUNCTIONAL IMPAIRMENT: How have these feelings affected your ability to do daily activities? Have  you had more difficulty than usual doing your normal daily activities? (e.g., getting better, same, worse; self-care, school, work, interactions)     Unable to go outside the hours by herself. Feels very depressed.  States that she does not want to do anything. States nothing is really fun, always afraid something bad is going to happen 6. HISTORY: Have you felt this way before? Have you ever been diagnosed with an anxiety problem in the past? (e.g., generalized anxiety disorder, panic attacks, PTSD). If Yes, ask: How was this problem treated? (e.g., medicines, counseling, etc.)     Yes, has been diagnosed in the past with anxiety 7. RISK OF HARM - SUICIDAL IDEATION: Do you ever have thoughts of hurting or killing yourself? If Yes, ask:  Do you have these feelings now? Do you have a plan on how you would do this?     Denies suicidal ideation 8. TREATMENT:  What has been done so far to treat this anxiety? (e.g., medicines, relaxation strategies). What has helped?     Celexa  and trazadone 9. THERAPIST: Do you have a counselor or therapist? If Yes, ask: What is their name?     Has talked to therapist in the past, but would like to find a new therapist  11. PATIENT SUPPORT: Who is with you now? Who do you live with? Do you have family or friends who you can talk to?        Has friend support, but does not to overwhelm them  12. OTHER SYMPTOMS: Do you have any other symptoms? (e.g., feeling depressed, trouble concentrating, trouble  sleeping, trouble breathing, palpitations or fast heartbeat, chest pain, sweating, nausea, or diarrhea)       depression  Answer Assessment - Initial Assessment Questions 1. DESCRIPTION: Describe your dizziness.     Feels like she is going to fall and most often happens outside, feels overwhelmed 2. LIGHTHEADED: Do you feel lightheaded? (e.g., somewhat faint, woozy, weak upon standing)     Feels like she is going to lose her balance.  The  open spaces trigger this 3. VERTIGO: Do you feel like either you or the room is spinning or tilting? (i.e., vertigo)     denies 4. SEVERITY: How bad is it?  Do you feel like you are going to faint? Can you stand and walk?     yes 5. ONSET:  When did the dizziness begin?    The really really bad just started a few months ago, but has always felt like everything outside is too big 6. AGGRAVATING FACTORS: Does anything make it worse? (e.g., standing, change in head position)     Going outside  8. CAUSE: What do you think is causing the dizziness? (e.g., decreased fluids or food, diarrhea, emotional distress, heat exposure, new medicine, sudden standing, vomiting; unknown)     anxiety  10. OTHER SYMPTOMS: Do you have any other symptoms? (e.g., fever, chest pain, vomiting, diarrhea, bleeding)       denies  Protocols used: Dizziness - Lightheadedness-A-AH, Anxiety and Panic Attack-A-AH

## 2024-10-30 ENCOUNTER — Ambulatory Visit: Admitting: Family Medicine

## 2024-10-31 ENCOUNTER — Telehealth: Admitting: Family Medicine

## 2024-10-31 DIAGNOSIS — F331 Major depressive disorder, recurrent, moderate: Secondary | ICD-10-CM

## 2024-10-31 MED ORDER — ALPRAZOLAM 0.5 MG PO TABS: 0.5000 mg | ORAL_TABLET | Freq: Two times a day (BID) | ORAL | 0 refills | Status: AC | PRN

## 2024-10-31 MED ORDER — BUPROPION HCL ER (XL) 150 MG PO TB24: 150.0000 mg | ORAL_TABLET | Freq: Every day | ORAL | 3 refills | Status: AC

## 2024-10-31 NOTE — Progress Notes (Signed)
 Subjective:    Patient ID: Emily Franco, female    DOB: 1971-09-09, 53 y.o.   MRN: [id number]  Anxiety     Patient is being seen today as a video visit.  She is currently at home.  I am currently in my office.  Video call began at 337.  Video call concluded at 357.  She consents to be seen via video.  Patient is battling severe depression.  She has been battling depression for years and has been taking Celexa  20 mg daily for this.  However over the last few months it has gotten worse.  As we approach the holidays, she is dealing with severe anhedonia and sadness.  In the last 4 years she has lost 2 sons.  The loss of her sons is only exacerbating her depression.  She reports that she is sleeping okay.  She reports that she is eating okay.  She denies any suicidal ideation however she does report lack of energy, trouble focusing, apathy, and a feeling of emptiness and hopelessness.  She denies any homicidal ideation.  She denies any suicidal ideation. Past Medical History:  Diagnosis Date   Anxiety    history of not lately   Depression    Depression    Hyperlipidemia    Hypertension    Neuropathy    Obesity    Past Surgical History:  Procedure Laterality Date   OPEN REDUCTION INTERNAL FIXATION (ORIF) PROXIMAL PHALANX Right 01/10/2023   Procedure: Right index finger proximal phalanx closed reduction percutaneus pinning versus open reduction internal fixation;  Surgeon: Alyse Agent, MD;  Location: [facility];  Service: Orthopedics;  Laterality: Right;   ORIF TOE FRACTURE Right 11/17/2014   Procedure: OPEN REDUCTION INTERNAL FIXATION (ORIF) RIGHT 5TH METATARSAL FRACTURE;  Surgeon: Kay Ozell Cummins, MD;  Location: [facility];  Service: Orthopedics;  Laterality: Right;   WISDOM TOOTH EXTRACTION     laughing gas   Current Outpatient Medications on File Prior to Visit  Medication Sig Dispense Refill   cephALEXin  (KEFLEX ) 500 MG capsule Take 1 capsule (500 mg total) by  mouth 3 (three) times daily. 21 capsule 0   citalopram  (CELEXA ) 20 MG tablet Take 1 tablet (20 mg total) by mouth daily. 30 tablet 1   rosuvastatin  (CRESTOR ) 10 MG tablet Take 1 tablet (10 mg total) by mouth daily. 30 tablet 1   traZODone  (DESYREL ) 100 MG tablet Take 1 tablet (100 mg total) by mouth at bedtime as needed for sleep. 90 tablet 3   No current facility-administered medications on file prior to visit.   No Known Allergies Social History   Socioeconomic History   Marital status: Divorced    Spouse name: Not on file   Number of children: Not on file   Years of education: Not on file   Highest education level: Not on file  Occupational History   Not on file  Tobacco Use   Smoking status: Never   Smokeless tobacco: Never  Vaping Use   Vaping status: Never Used  Substance and Sexual Activity   Alcohol use: Not Currently    Alcohol/week: 4.0 standard drinks of alcohol    Types: 4 Glasses of wine per week    Comment: occasional   Drug use: Yes    Types: Marijuana    Comment: occ   Sexual activity: Not on file  Other Topics Concern   Not on file  Social History Narrative   Son died 03/29/24 due to overdose.  Another son died 2021 due to overdose.   Daughter living.    Social Drivers of Corporate Investment Banker Strain: Not on file  Food Insecurity: Not on file  Transportation Needs: Not on file  Physical Activity: Not on file  Stress: Not on file  Social Connections: Unknown (04/26/2022)   Received from [facility]   Social Network    Social Network: Not on file  Intimate Partner Violence: Unknown (03/18/2022)   Received from [facility]   HITS    Physically Hurt: Not on file    Insult or Talk Down To: Not on file    Threaten Physical Harm: Not on file    Scream or Curse: Not on file    Once open   Review of Systems  All other systems reviewed and are negative.      Objective:   Physical Exam        Assessment & Plan:   Moderate episode  of recurrent major depressive disorder (HCC) We discussed her options between adding Wellbutrin  extended release 150 mg daily as well as versus adding Rexulti or Vraylar to Celexa .  After we discussed the pros and cons, the patient elects to add Wellbutrin  150 mg extended release to her Celexa  and recheck in 3 to 4 weeks.  Also [facility] and [facility]

## 2024-10-31 NOTE — Addendum Note (Signed)
 Addended by: DUANNE LOWERS T on: 10/31/2024 04:29 PM   Modules accepted: Orders

## 2024-11-14 ENCOUNTER — Telehealth: Payer: Self-pay

## 2024-11-14 NOTE — Telephone Encounter (Signed)
 Copied from CRM [id number]. Topic: Clinical - Prescription Issue >> Nov 14, 2024  3:13 PM Kendralyn S wrote: Reason for CRM: requesting a prescription for a cane, please advise

## 2024-12-09 NOTE — Telephone Encounter (Signed)
 Patient called saying her pharmacy said they do not take orders for a cane.  Patient wants to know where can she get a cane from that will accept an order so insurance will pay .  [phone]

## 2025-01-01 ENCOUNTER — Telehealth: Payer: Self-pay | Admitting: Family Medicine

## 2025-01-01 NOTE — Telephone Encounter (Unsigned)
 Copied from CRM #[id number]. Topic: General - Other >> Jan 01, 2025  3:42 PM Tiffini S wrote: Reason for CRM: Patient called asking for a update for the cane- [address] [place], [place] [address] Please call the patient at [phone] to discuss as she is temporally living at this location

## 2025-01-07 ENCOUNTER — Telehealth: Payer: Self-pay

## 2025-01-07 NOTE — Telephone Encounter (Signed)
 Copied from CRM #[id number]. Topic: General - Other >> Jan 01, 2025  3:42 PM Tiffini S wrote: Reason for CRM: Patient called asking for a update for the cane- [address] [place] [place], [place] [address] Please call the patient at [phone] to discuss as she is temporally living at this location >> Jan 03, 2025  4:09 PM Terri G wrote: Patient stated [facility] needs updated notes/diagnosis for patient and that we need to call them and provide them that info. [phone]

## 2025-01-16 ENCOUNTER — Ambulatory Visit: Admitting: Family Medicine

## 2025-01-23 ENCOUNTER — Ambulatory Visit: Admitting: Family Medicine
